# Patient Record
Sex: Female | Born: 1935 | Race: White | Hispanic: No | Marital: Married | Smoking: Never smoker
Health system: Southern US, Community
[De-identification: ages and names within clinical notes are randomized; demographics above are authoritative.]

## PROBLEM LIST (undated history)

## (undated) DIAGNOSIS — K56609 Unspecified intestinal obstruction, unspecified as to partial versus complete obstruction: Secondary | ICD-10-CM

## (undated) DIAGNOSIS — E039 Hypothyroidism, unspecified: Secondary | ICD-10-CM

## (undated) DIAGNOSIS — K460 Unspecified abdominal hernia with obstruction, without gangrene: Secondary | ICD-10-CM

## (undated) HISTORY — PX: HERNIA REPAIR: SHX51

## (undated) HISTORY — PX: OTHER SURGICAL HISTORY: SHX169

## (undated) HISTORY — PX: BOWEL RESECTION: SHX1257

---

## 2014-02-24 ENCOUNTER — Emergency Department (HOSPITAL_BASED_OUTPATIENT_CLINIC_OR_DEPARTMENT_OTHER): Payer: Medicare Other

## 2014-02-24 ENCOUNTER — Emergency Department (HOSPITAL_BASED_OUTPATIENT_CLINIC_OR_DEPARTMENT_OTHER)
Admission: EM | Admit: 2014-02-24 | Discharge: 2014-02-24 | Disposition: A | Discharge status: Home / Self Care | Payer: Medicare Other | Attending: Emergency Medicine | Admitting: Emergency Medicine

## 2014-02-24 ENCOUNTER — Encounter (HOSPITAL_BASED_OUTPATIENT_CLINIC_OR_DEPARTMENT_OTHER): Payer: Self-pay | Admitting: Emergency Medicine

## 2014-02-24 DIAGNOSIS — Z79899 Other long term (current) drug therapy: Secondary | ICD-10-CM | POA: Insufficient documentation

## 2014-02-24 DIAGNOSIS — R1033 Periumbilical pain: Secondary | ICD-10-CM | POA: Insufficient documentation

## 2014-02-24 DIAGNOSIS — Z8719 Personal history of other diseases of the digestive system: Secondary | ICD-10-CM | POA: Insufficient documentation

## 2014-02-24 DIAGNOSIS — R1013 Epigastric pain: Secondary | ICD-10-CM | POA: Insufficient documentation

## 2014-02-24 DIAGNOSIS — R109 Unspecified abdominal pain: Secondary | ICD-10-CM

## 2014-02-24 HISTORY — DX: Unspecified intestinal obstruction, unspecified as to partial versus complete obstruction: K56.609

## 2014-02-24 HISTORY — DX: Unspecified abdominal hernia with obstruction, without gangrene: K46.0

## 2014-02-24 LAB — CBC WITH DIFFERENTIAL/PLATELET
BASOS ABS: 0 10*3/uL (ref 0.0–0.1)
Basophils Relative: 0 % (ref 0–1)
EOS PCT: 3 % (ref 0–5)
Eosinophils Absolute: 0.2 10*3/uL (ref 0.0–0.7)
HCT: 39.6 % (ref 36.0–46.0)
Hemoglobin: 13 g/dL (ref 12.0–15.0)
LYMPHS PCT: 17 % (ref 12–46)
Lymphs Abs: 1 10*3/uL (ref 0.7–4.0)
MCH: 30 pg (ref 26.0–34.0)
MCHC: 32.8 g/dL (ref 30.0–36.0)
MCV: 91.5 fL (ref 78.0–100.0)
Monocytes Absolute: 0.5 10*3/uL (ref 0.1–1.0)
Monocytes Relative: 8 % (ref 3–12)
NEUTROS PCT: 72 % (ref 43–77)
Neutro Abs: 4.1 10*3/uL (ref 1.7–7.7)
Platelets: 242 10*3/uL (ref 150–400)
RBC: 4.33 MIL/uL (ref 3.87–5.11)
RDW: 13.7 % (ref 11.5–15.5)
WBC: 5.7 10*3/uL (ref 4.0–10.5)

## 2014-02-24 LAB — COMPREHENSIVE METABOLIC PANEL
ALT: 9 U/L (ref 0–35)
AST: 13 U/L (ref 0–37)
Albumin: 4.4 g/dL (ref 3.5–5.2)
Alkaline Phosphatase: 45 U/L (ref 39–117)
BUN: 21 mg/dL (ref 6–23)
CALCIUM: 10.4 mg/dL (ref 8.4–10.5)
CO2: 31 meq/L (ref 19–32)
CREATININE: 1.2 mg/dL — AB (ref 0.50–1.10)
Chloride: 99 mEq/L (ref 96–112)
GFR calc Af Amer: 50 mL/min — ABNORMAL LOW (ref >90)
GFR, EST NON AFRICAN AMERICAN: 43 mL/min — AB (ref >90)
Glucose, Bld: 121 mg/dL — ABNORMAL HIGH (ref 70–99)
Potassium: 4.9 mEq/L (ref 3.7–5.3)
Sodium: 142 mEq/L (ref 137–147)
TOTAL PROTEIN: 6.9 g/dL (ref 6.0–8.3)
Total Bilirubin: 0.4 mg/dL (ref 0.3–1.2)

## 2014-02-24 LAB — LIPASE, BLOOD: Lipase: 39 U/L (ref 11–59)

## 2014-02-24 MED ORDER — SODIUM CHLORIDE 0.9 % IV BOLUS (SEPSIS)
500.0000 mL | Freq: Once | INTRAVENOUS | Status: AC
  Administered 2014-02-24: 500 mL via INTRAVENOUS

## 2014-02-24 MED ORDER — IOHEXOL 300 MG/ML  SOLN
80.0000 mL | Freq: Once | INTRAMUSCULAR | Status: AC | PRN
  Administered 2014-02-24: 80 mL via INTRAVENOUS

## 2014-02-24 MED ORDER — IOHEXOL 300 MG/ML  SOLN
50.0000 mL | Freq: Once | INTRAMUSCULAR | Status: AC | PRN
  Administered 2014-02-24: 50 mL via ORAL

## 2014-02-24 NOTE — ED Notes (Signed)
MD at bedside. 

## 2014-02-24 NOTE — ED Notes (Signed)
Patient transported to CT 

## 2014-02-24 NOTE — ED Provider Notes (Signed)
CSN: [id number]     Arrival date & time 02/24/14  1947 History  This chart was scribed for Geoffery Lyonsouglas Chauna Osoria, MD by Evon Slackerrance Branch, ED Scribe. This patient was seen in room MH01/MH01 and the patient's care was started at 8:00 PM.   Chief Complaint  Patient presents with  . Abdominal Pain   Patient is a 79 y.o. female presenting with abdominal pain. The history is provided by the patient. No language interpreter was used.  Abdominal Pain Associated symptoms: no chest pain, no diarrhea, no dysuria, no fever, no shortness of breath and no vomiting    HPI Comments: Christina RobertsHelen Graves is a 79 y.o. female who presents to the Emergency Department complaining of abdominal pain onset 2 hours prior. She states the pain is all over. She states she has had similar symptoms when she had an incarcerated hernia 16 years prior. States she is on Amitiza. She states she hasn't had a bowel movement since Thursday. She states that she has been having air over the past few days. She denies chest pain, SOB, vomiting, diarrhea, fever, dysuria, or back pain.   Past Medical History  Diagnosis Date  . Bowel obstruction   . Incarcerated hernia    Past Surgical History  Procedure Laterality Date  . Hernia repair     No family history on file. History  Substance Use Topics  . Smoking status: Never Smoker   . Smokeless tobacco: Not on file  . Alcohol Use: No   OB History   Grav Para Term Preterm Abortions TAB SAB Ect Mult Living                 Review of Systems  Constitutional: Negative for fever.  Respiratory: Negative for shortness of breath.   Cardiovascular: Negative for chest pain.  Gastrointestinal: Positive for abdominal pain. Negative for vomiting and diarrhea.  Genitourinary: Negative for dysuria.  Musculoskeletal: Negative for back pain.   A complete 10 system review of systems was obtained and all systems are negative except as noted in the HPI and PMH.     Allergies  Oxycodone  Home  Medications   Prior to Admission medications   Medication Sig Start Date End Date Taking? Authorizing Provider  ARIPiprazole (ABILIFY PO) Take by mouth.   Yes Historical Provider, MD  LAMOTRIGINE PO Take by mouth.   Yes Historical Provider, MD  lubiprostone (AMITIZA) 8 MCG capsule    Yes Historical Provider, MD   Triage Vitals: BP 177/68  Pulse 64  Temp(Src) 98.2 F (36.8 C) (Oral)  Resp 18  Ht 5\' 7"  (1.702 m)  Wt 135 lb (61.236 kg)  BMI 21.14 kg/m2  SpO2 100%  Physical Exam  Nursing note and vitals reviewed. Constitutional: She is oriented to person, place, and time. She appears well-developed and well-nourished. No distress.  HENT:  Head: Normocephalic and atraumatic.  Eyes: Conjunctivae and EOM are normal.  Neck: Normal range of motion. No tracheal deviation present.  Cardiovascular: Normal rate.   Pulmonary/Chest: Effort normal. No respiratory distress.  Abdominal: There is tenderness. There is no rebound and no guarding.  Tenderness to epigastrium and paraumbilical region   Musculoskeletal: Normal range of motion.  Neurological: She is alert and oriented to person, place, and time.  Skin: Skin is warm and dry.  Psychiatric: She has a normal mood and affect. Her behavior is normal.    ED Course  Procedures (including critical care time) DIAGNOSTIC STUDIES: Oxygen Saturation is 100% on RA, normal by my interpretation.  COORDINATION OF CARE: 8:06 PM-Discussed treatment plan which includes CT scan of abdomen, CBC panel, CMP,  And UA with pt at bedside and pt agreed to plan.     Labs Review Labs Reviewed  COMPREHENSIVE METABOLIC PANEL - Abnormal; Notable for the following:    Glucose, Bld 121 (*)    Creatinine, Ser 1.20 (*)    GFR calc non Af Amer 43 (*)    GFR calc Af Amer 50 (*)    All other components within normal limits  CBC WITH DIFFERENTIAL  LIPASE, BLOOD  URINALYSIS, ROUTINE W REFLEX MICROSCOPIC    Imaging Review No results found.   EKG  Interpretation None      MDM   Final diagnoses:  None   Patient presents with severe abdominal pain.  On exam, she is tender in the epigastric and periumbilical region with no rebound or guarding. Blood counts revealed no leukocytosis electrolytes are essentially unremarkable. There is no elevation of lipase to suggest pancreatitis. A CT scan of the abdomen and pelvis was obtained which revealed no acute intra-abdominal abnormality. There is no evidence for bowel obstruction. While in the emergency department, her symptoms significantly improved. I see nothing acutely abnormal in need of surgical consultation or admission. She will be discharged to home with recommendations to take magnesium citrate and she tells me she has not had a bowel movement in 2 or 3 days. She understands to return if she develops worsening pain, high fever, or bloody stool.   I personally performed the services described in this documentation, which was scribed in my presence. The recorded information has been reviewed and is accurate.       Geoffery Lyonsouglas Lurie Mullane, MD 02/24/14 2239

## 2014-02-24 NOTE — ED Notes (Signed)
Attempt x 2 unsuccessful for piv placement- labs drawn on second attempt and sent to lab- pt drinking oral CT contrast at this time- will reassess for piv placement

## 2014-02-24 NOTE — ED Notes (Signed)
Reports onset of severe abdominal pain several hours ago.  Reports this pain is similar to a bowel obstruction/ incarcerated hernia 16 years ago.  Denies chest pain, SHOB.  Denies vomiting, diarrhea, or fever.

## 2014-02-24 NOTE — Discharge Instructions (Signed)
Magnesium citrate: Drink 2/3 of the 10 ounce bottle mixed with equal parts Gatorade or Sprite.  Return to the emergency department if you develop worsening pain, high fever, bloody stool, or other new and concerning symptoms.   Abdominal Pain, Adult Many things can cause abdominal pain. Usually, abdominal pain is not caused by a disease and will improve without treatment. It can often be observed and treated at home. Your health care provider will do a physical exam and possibly order blood tests and X-rays to help determine the seriousness of your pain. However, in many cases, more time must pass before a clear cause of the pain can be found. Before that point, your health care provider may not know if you need more testing or further treatment. HOME CARE INSTRUCTIONS  Monitor your abdominal pain for any changes. The following actions may help to alleviate any discomfort you are experiencing:  Only take over-the-counter or prescription medicines as directed by your health care provider.  Do not take laxatives unless directed to do so by your health care provider.  Try a clear liquid diet (broth, tea, or water) as directed by your health care provider. Slowly move to a bland diet as tolerated. SEEK MEDICAL CARE IF:  You have unexplained abdominal pain.  You have abdominal pain associated with nausea or diarrhea.  You have pain when you urinate or have a bowel movement.  You experience abdominal pain that wakes you in the night.  You have abdominal pain that is worsened or improved by eating food.  You have abdominal pain that is worsened with eating fatty foods. SEEK IMMEDIATE MEDICAL CARE IF:   Your pain does not go away within 2 hours.  You have a fever.  You keep throwing up (vomiting).  Your pain is felt only in portions of the abdomen, such as the right side or the left lower portion of the abdomen.  You pass bloody or black tarry stools. MAKE SURE YOU:  Understand these  instructions.   Will watch your condition.   Will get help right away if you are not doing well or get worse.  Document Released: 06/10/2005 Document Revised: 06/21/2013 Document Reviewed: 05/10/2013 [facility] Patient Information 2014 [place], [place].

## 2016-01-13 ENCOUNTER — Emergency Department (HOSPITAL_BASED_OUTPATIENT_CLINIC_OR_DEPARTMENT_OTHER): Payer: Medicare Other

## 2016-01-13 ENCOUNTER — Encounter (HOSPITAL_BASED_OUTPATIENT_CLINIC_OR_DEPARTMENT_OTHER): Payer: Self-pay

## 2016-01-13 ENCOUNTER — Inpatient Hospital Stay (HOSPITAL_BASED_OUTPATIENT_CLINIC_OR_DEPARTMENT_OTHER)
Admission: EM | Admit: 2016-01-13 | Discharge: 2016-01-15 | DRG: 390 | Disposition: A | Discharge status: Home / Self Care | Payer: Medicare Other | Attending: Internal Medicine | Admitting: Internal Medicine

## 2016-01-13 DIAGNOSIS — K567 Ileus, unspecified: Secondary | ICD-10-CM

## 2016-01-13 DIAGNOSIS — Z79899 Other long term (current) drug therapy: Secondary | ICD-10-CM | POA: Diagnosis not present

## 2016-01-13 DIAGNOSIS — Z885 Allergy status to narcotic agent status: Secondary | ICD-10-CM

## 2016-01-13 DIAGNOSIS — N183 Chronic kidney disease, stage 3 unspecified: Secondary | ICD-10-CM

## 2016-01-13 DIAGNOSIS — F319 Bipolar disorder, unspecified: Secondary | ICD-10-CM | POA: Diagnosis present

## 2016-01-13 DIAGNOSIS — K5909 Other constipation: Secondary | ICD-10-CM | POA: Diagnosis present

## 2016-01-13 DIAGNOSIS — Z853 Personal history of malignant neoplasm of breast: Secondary | ICD-10-CM

## 2016-01-13 DIAGNOSIS — Z923 Personal history of irradiation: Secondary | ICD-10-CM

## 2016-01-13 DIAGNOSIS — Z8659 Personal history of other mental and behavioral disorders: Secondary | ICD-10-CM | POA: Diagnosis not present

## 2016-01-13 DIAGNOSIS — K565 Intestinal adhesions [bands], unspecified as to partial versus complete obstruction: Secondary | ICD-10-CM

## 2016-01-13 DIAGNOSIS — K56609 Unspecified intestinal obstruction, unspecified as to partial versus complete obstruction: Secondary | ICD-10-CM | POA: Diagnosis present

## 2016-01-13 DIAGNOSIS — K5669 Other intestinal obstruction: Secondary | ICD-10-CM | POA: Diagnosis not present

## 2016-01-13 DIAGNOSIS — E039 Hypothyroidism, unspecified: Secondary | ICD-10-CM | POA: Diagnosis present

## 2016-01-13 DIAGNOSIS — E038 Other specified hypothyroidism: Secondary | ICD-10-CM | POA: Diagnosis not present

## 2016-01-13 DIAGNOSIS — R109 Unspecified abdominal pain: Secondary | ICD-10-CM | POA: Diagnosis present

## 2016-01-13 HISTORY — DX: Hypothyroidism, unspecified: E03.9

## 2016-01-13 LAB — CBC WITH DIFFERENTIAL/PLATELET
BASOS ABS: 0 10*3/uL (ref 0.0–0.1)
BASOS PCT: 1 %
EOS ABS: 0.2 10*3/uL (ref 0.0–0.7)
Eosinophils Relative: 3 %
HCT: 40.6 % (ref 36.0–46.0)
HEMOGLOBIN: 13.2 g/dL (ref 12.0–15.0)
LYMPHS ABS: 0.8 10*3/uL (ref 0.7–4.0)
Lymphocytes Relative: 15 %
MCH: 29.9 pg (ref 26.0–34.0)
MCHC: 32.5 g/dL (ref 30.0–36.0)
MCV: 92.1 fL (ref 78.0–100.0)
Monocytes Absolute: 0.5 10*3/uL (ref 0.1–1.0)
Monocytes Relative: 9 %
NEUTROS PCT: 72 %
Neutro Abs: 3.9 10*3/uL (ref 1.7–7.7)
Platelets: 228 10*3/uL (ref 150–400)
RBC: 4.41 MIL/uL (ref 3.87–5.11)
RDW: 13.7 % (ref 11.5–15.5)
WBC: 5.3 10*3/uL (ref 4.0–10.5)

## 2016-01-13 LAB — COMPREHENSIVE METABOLIC PANEL
ALT: 11 U/L — ABNORMAL LOW (ref 14–54)
AST: 16 U/L (ref 15–41)
Albumin: 4.6 g/dL (ref 3.5–5.0)
Alkaline Phosphatase: 45 U/L (ref 38–126)
Anion gap: 8 (ref 5–15)
BUN: 22 mg/dL — AB (ref 6–20)
CHLORIDE: 102 mmol/L (ref 101–111)
CO2: 29 mmol/L (ref 22–32)
Calcium: 10 mg/dL (ref 8.9–10.3)
Creatinine, Ser: 1.22 mg/dL — ABNORMAL HIGH (ref 0.44–1.00)
GFR calc Af Amer: 48 mL/min — ABNORMAL LOW (ref >60)
GFR calc non Af Amer: 41 mL/min — ABNORMAL LOW (ref >60)
Glucose, Bld: 108 mg/dL — ABNORMAL HIGH (ref 65–99)
Potassium: 4.2 mmol/L (ref 3.5–5.1)
SODIUM: 139 mmol/L (ref 135–145)
Total Bilirubin: 0.6 mg/dL (ref 0.3–1.2)
Total Protein: 7.1 g/dL (ref 6.5–8.1)

## 2016-01-13 LAB — LIPASE, BLOOD: LIPASE: 27 U/L (ref 11–51)

## 2016-01-13 MED ORDER — IOPAMIDOL (ISOVUE-300) INJECTION 61%
100.0000 mL | Freq: Once | INTRAVENOUS | Status: AC | PRN
  Administered 2016-01-13: 100 mL via INTRAVENOUS

## 2016-01-13 MED ORDER — SODIUM CHLORIDE 0.9 % IV SOLN
INTRAVENOUS | Status: DC
  Administered 2016-01-14: 01:00:00 via INTRAVENOUS

## 2016-01-13 MED ORDER — SODIUM CHLORIDE 0.9 % IV BOLUS (SEPSIS)
250.0000 mL | Freq: Once | INTRAVENOUS | Status: AC
  Administered 2016-01-13: 250 mL via INTRAVENOUS

## 2016-01-13 MED ORDER — ONDANSETRON HCL 4 MG/2ML IJ SOLN
4.0000 mg | Freq: Once | INTRAMUSCULAR | Status: AC
  Administered 2016-01-13: 4 mg via INTRAVENOUS
  Filled 2016-01-13: qty 2

## 2016-01-13 MED ORDER — SODIUM CHLORIDE 0.9 % IV SOLN
INTRAVENOUS | Status: DC
  Administered 2016-01-13: 19:00:00 via INTRAVENOUS

## 2016-01-13 MED ORDER — FENTANYL CITRATE (PF) 100 MCG/2ML IJ SOLN
12.5000 ug | Freq: Once | INTRAMUSCULAR | Status: AC
  Administered 2016-01-13: 12.5 ug via INTRAVENOUS
  Filled 2016-01-13: qty 2

## 2016-01-13 NOTE — ED Notes (Signed)
Pt ambulate to restroom without assistance needed.

## 2016-01-13 NOTE — Plan of Care (Signed)
81 yo w prior history of groin strangulated hernia requiring surgery years ago here with small bowel obstruction Case has been discussed with Dr. Gerrit FriendsGerkin with carotid surgery patient accepted to MedSurg bed inpatient Christina Graves 10:07 PM

## 2016-01-13 NOTE — ED Notes (Signed)
Patient transported to CT 

## 2016-01-13 NOTE — ED Notes (Signed)
abd pain x today-denies n/v/d-grimacing in pain-steady gait to tx area

## 2016-01-13 NOTE — ED Provider Notes (Addendum)
CSN: [id number]     Arrival date & time 01/13/16  1820 History  By signing my name below, I, [name], attest that this documentation has been prepared under the direction and in the presence of physician practitioner, Vanetta Mulders, MD. Electronically Signed: [name], Scribe. 01/13/2016. 6:58 PM.   Chief Complaint  Patient presents with  . Abdominal Pain    Patient is a 81 y.o. female presenting with abdominal pain. The history is provided by the patient and a relative. No language interpreter was used.  Abdominal Pain Pain location:  Generalized Pain severity:  Severe Onset quality:  Sudden Duration:  2 hours Timing:  Intermittent Progression:  Unable to specify Chronicity:  New Associated symptoms: constipation   Associated symptoms: no chest pain, no chills, no cough, no diarrhea, no dysuria, no fever, no nausea, no shortness of breath, no sore throat and no vomiting   Risk factors: being elderly      HPI Comments: Christina Graves is a 81 y.o. female with h/o bowel obstruction and incarcerated hernia who presents to the Emergency Department complaining of sudden onset, intermittent, severe, 10/10, generalized abdominal pain beginning approximately 2 hours ago. Pt states that her pain does not radiate. She endorses associated constipation. Pt is allergic to oxycodone. She recently completed radiation therapy for cancer. Pt denies nausea, vomiting, diarrhea, or any other associated symptoms. Pt has a PCP at [facility].  Past Medical History  Diagnosis Date  . Bowel obstruction (HCC)   . Incarcerated hernia    Past Surgical History  Procedure Laterality Date  . Hernia repair    . Bowel resection     No family history on file. Social History  Substance Use Topics  . Smoking status: Never Smoker   . Smokeless tobacco: None  . Alcohol Use: No   OB History    No data available     Review of Systems  Constitutional: Negative for fever and  chills.  HENT: Negative for rhinorrhea and sore throat.   Eyes: Negative for visual disturbance.  Respiratory: Negative for cough and shortness of breath.   Cardiovascular: Negative for chest pain.  Gastrointestinal: Positive for abdominal pain (generalized) and constipation. Negative for nausea, vomiting and diarrhea.  Genitourinary: Negative for dysuria.  Musculoskeletal: Negative for back pain and joint swelling.  Skin: Negative for rash.  Neurological: Negative for headaches.  Hematological: Does not bruise/bleed easily.  Psychiatric/Behavioral: Negative for confusion.    Allergies  Oxycodone  Home Medications   Prior to Admission medications   Medication Sig Start Date End Date Taking? Authorizing Provider  ARIPiprazole (ABILIFY PO) Take 15 mg by mouth every morning.    Yes Historical Provider, MD  Ciclopirox 1 % shampoo Apply topically at bedtime.   Yes Historical Provider, MD  LAMOTRIGINE PO Take 100 mg by mouth every morning.    Yes Historical Provider, MD  levothyroxine (SYNTHROID, LEVOTHROID) 50 MCG tablet Take 50 mcg by mouth daily before breakfast.   Yes Historical Provider, MD  metroNIDAZOLE (METROGEL) 1 % gel Apply topically daily.   Yes Historical Provider, MD  traZODone (DESYREL) 100 MG tablet Take 100 mg by mouth at bedtime.   Yes Historical Provider, MD  lubiprostone (AMITIZA) 8 MCG capsule 24 mcg every evening.     Historical Provider, MD   BP 119/71 mmHg  Pulse 67  Temp(Src) 98.2 F (36.8 C) (Oral)  Resp 18  SpO2 96% Physical Exam  Constitutional: She is oriented to person, place, and time.  She appears well-developed and well-nourished. No distress.  HENT:  Head: Normocephalic and atraumatic.  Mouth/Throat: Mucous membranes are normal.  Eyes: Conjunctivae and EOM are normal. Pupils are equal, round, and reactive to light.  Sclera are clear bilaterally.  Neck: Neck supple. No tracheal deviation present.  Cardiovascular: Normal rate, regular rhythm and  normal heart sounds.   Pulmonary/Chest: Effort normal and breath sounds normal. No respiratory distress.  Abdominal: She exhibits distension. Bowel sounds are decreased. There is no tenderness.  Bowel sounds present but decreased.  Musculoskeletal: Normal range of motion.       Right ankle: She exhibits no swelling.       Left ankle: She exhibits no swelling.  Neurological: She is alert and oriented to person, place, and time. She has normal reflexes. She displays normal reflexes. She exhibits normal muscle tone. Coordination normal.  Skin: Skin is warm and dry.  Psychiatric: She has a normal mood and affect. Her behavior is normal.  Nursing note and vitals reviewed.   ED Course  Procedures (including critical care time)  DIAGNOSTIC STUDIES: Oxygen Saturation is 96% on RA, adequate by my interpretation.    COORDINATION OF CARE: 6:58 PM Discussed treatment plan with pt at bedside and pt agreed to plan.  Medications  0.9 %  sodium chloride infusion ( Intravenous New Bag/Given 01/13/16 1915)  ondansetron (ZOFRAN) injection 4 mg (4 mg Intravenous Given 01/13/16 1914)  sodium chloride 0.9 % bolus 250 mL (0 mLs Intravenous Stopped 01/13/16 1940)  fentaNYL (SUBLIMAZE) injection 12.5 mcg (12.5 mcg Intravenous Given 01/13/16 1914)  iopamidol (ISOVUE-300) 61 % injection 100 mL (100 mLs Intravenous Contrast Given 01/13/16 2006)    Results for orders placed or performed during the hospital encounter of 01/13/16  CBC with Differential/Platelet  Result Value Ref Range   WBC 5.3 4.0 - 10.5 K/uL   RBC 4.41 3.87 - 5.11 MIL/uL   Hemoglobin 13.2 12.0 - 15.0 g/dL   HCT 40.9 81.1 - 91.4 %   MCV 92.1 78.0 - 100.0 fL   MCH 29.9 26.0 - 34.0 pg   MCHC 32.5 30.0 - 36.0 g/dL   RDW 78.2 95.6 - 21.3 %   Platelets 228 150 - 400 K/uL   Neutrophils Relative % 72 %   Neutro Abs 3.9 1.7 - 7.7 K/uL   Lymphocytes Relative 15 %   Lymphs Abs 0.8 0.7 - 4.0 K/uL   Monocytes Relative 9 %   Monocytes Absolute 0.5 0.1 -  1.0 K/uL   Eosinophils Relative 3 %   Eosinophils Absolute 0.2 0.0 - 0.7 K/uL   Basophils Relative 1 %   Basophils Absolute 0.0 0.0 - 0.1 K/uL  Lipase, blood  Result Value Ref Range   Lipase 27 11 - 51 U/L  Comprehensive metabolic panel  Result Value Ref Range   Sodium 139 135 - 145 mmol/L   Potassium 4.2 3.5 - 5.1 mmol/L   Chloride 102 101 - 111 mmol/L   CO2 29 22 - 32 mmol/L   Glucose, Bld 108 (H) 65 - 99 mg/dL   BUN 22 (H) 6 - 20 mg/dL   Creatinine, Ser 0.86 (H) 0.44 - 1.00 mg/dL   Calcium 57.8 8.9 - 46.9 mg/dL   Total Protein 7.1 6.5 - 8.1 g/dL   Albumin 4.6 3.5 - 5.0 g/dL   AST 16 15 - 41 U/L   ALT 11 (L) 14 - 54 U/L   Alkaline Phosphatase 45 38 - 126 U/L   Total Bilirubin 0.6 0.3 -  1.2 mg/dL   GFR calc non Af Amer 41 (L) >60 mL/min   GFR calc Af Amer 48 (L) >60 mL/min   Anion gap 8 5 - 15   Ct Abdomen Pelvis W Contrast  01/13/2016  CLINICAL DATA:  Generalized abdominal pain since 5 p.m. tonight. History of herniated incarcerated bowel. EXAM: CT ABDOMEN AND PELVIS WITH CONTRAST TECHNIQUE: Multidetector CT imaging of the abdomen and pelvis was performed using the standard protocol following bolus administration of intravenous contrast. CONTRAST:  100mL ISOVUE-300 IOPAMIDOL (ISOVUE-300) INJECTION 61% COMPARISON:  CT abdomen pelvis -02/24/2014 FINDINGS: Lower chest: Limited visualization of the lower thorax demonstrates minimal linear subsegmental atelectasis within the image right lower lobe. No focal airspace opacities. No pleural effusion. Normal heart size.  No pericardial effusion. Hepatobiliary: Normal hepatic contour. Note is made of an approximately 1.1 cm hypo attenuating (18 Hounsfield unit) cyst within the caudal subcapsular aspect of the medial segment of the left lobe of the liver (coronal image 41, series 5). Additional sub cm hypo attenuating hepatic lesions too small to accurately characterize though favored to represent hepatic cysts. Normal appearance of the  gallbladder given degree distention. No radiopaque gallstones. No intra or extrahepatic bili duct dilatation. No ascites. Pancreas: Normal in appearance Spleen: Normal in appearance Adrenals/Urinary Tract: There is symmetric enhancement and excretion of the bilateral kidneys. Note is made of a large (approximately 7.4 cm hypo attenuating (7 Hounsfield unit) cyst which arises exophytic Li from the interpolar aspect of the right kidney and is noted to contain a thin internal septation, similar to the 02/2014 examination. Additional renal cysts are seen bilaterally. Sub cm hypo attenuating renal lesions are too small to adequately characterize of favored to represent additional renal cysts. Punctate (approximately 1.3 cm) exophytic hyper attenuating though nonenhancing lesion arising from the inferior pole the left kidney is unchanged since the 02/2014 examination and compatible with a hyperdense renal cysts. No definite renal stones this postcontrast examination. Note is again made of a exaggerated horizontal lie of the right kidney. No urinary obstruction or perinephric stranding. Normal appearance of the bilateral adrenal glands. Normal appearance of the urinary bladder given degree distention. Multiple phleboliths are seen within the lower pelvis bilaterally. Stomach/Bowel: Postsurgical change involving a loop of small bowel within the midline of the abdomen (representative coronal image 10, series 5). There is mild upstream fluid distention of the small bowel with apparent transition point within the distal small bowel within the midline of the lower abdomen/upper pelvis (representative axial image 53, series 2, coronal image 24, series 5) with relative decompression of the more distal downstream small bowel worrisome for a developing small bowel obstruction. No evidence of perforation or definable/ drainable fluid collection. No pneumoperitoneum, pneumatosis or portal venous gas. Scattered colonic diverticulosis  without evidence of diverticulitis. Normal appearance of the terminal ileum and diminutive appearing appendix. Vascular/Lymphatic: Moderate amount of slightly irregular predominantly calcified plaque within a tortuous but normal caliber abdominal aorta. The major branch vessels of the abdominal aorta appear patent on this non CTA examination. No bulky retroperitoneal, mesenteric, pelvic or inguinal lymphadenopathy. Reproductive: Normal appearance of the pelvic organs for age. No discrete adnexal lesion. No free fluid in the pelvic cul-de-sac. Other: Tiny mesenteric fat containing periumbilical hernia. Regional soft tissues appear otherwise normal. Musculoskeletal: No acute or aggressive osseous abnormalities. Mild-to-moderate multilevel lumbar spine DDD. Old bilateral superior and inferior pubic rami fractures. Mild degenerative change of the bilateral hips, right greater than left. IMPRESSION: 1. Findings worrisome for small bowel obstruction  with transition point located within the midline of the lower abdomen/upper pelvis. The etiology of this apparent transition point is not identified and thus is presumably secondary to adhesions. 2. Postsurgical change involving an adjacent loop of small bowel. 3. Colonic diverticulosis without evidence of diverticulitis. 4. Old bilateral superior and inferior pubic rami fractures Electronically Signed   By: Simonne Come M.D.   On: 01/13/2016 20:34     EKG Interpretation None      MDM   Final diagnoses:  Small bowel obstruction (HCC)   CT scan concerning for small bowel obstruction with transit position point located in the midline of the lower abdomen upper pelvis. We'll discuss with general surgery at [facility]. Patient relatively health the. Patient did have a bowel obstruction due to incarcerated hernia with strangulation about 12 years ago. Did have to have part of bowel resected. Otherwise healthy.  Recent diagnosis of early breast cancer with  the lumpectomy and radiation treatment. Never received any chemotherapy.   Discussed with Dr. Gerrit Friends from general surgery he will consult. Internal medicine hospitalist will admit to [facility] long MedSurg bed.  I personally performed the services described in this documentation, which was scribed in my presence. The recorded information has been reviewed and is accurate.     Vanetta Mulders, MD 01/13/16 [id number]  Vanetta Mulders, MD 01/13/16 [phone]

## 2016-01-14 ENCOUNTER — Inpatient Hospital Stay (HOSPITAL_COMMUNITY): Payer: Medicare Other

## 2016-01-14 ENCOUNTER — Encounter (HOSPITAL_COMMUNITY): Payer: Self-pay | Admitting: Surgery

## 2016-01-14 DIAGNOSIS — Z8659 Personal history of other mental and behavioral disorders: Secondary | ICD-10-CM

## 2016-01-14 DIAGNOSIS — Z853 Personal history of malignant neoplasm of breast: Secondary | ICD-10-CM

## 2016-01-14 DIAGNOSIS — E039 Hypothyroidism, unspecified: Secondary | ICD-10-CM | POA: Diagnosis present

## 2016-01-14 DIAGNOSIS — K5669 Other intestinal obstruction: Secondary | ICD-10-CM

## 2016-01-14 DIAGNOSIS — N183 Chronic kidney disease, stage 3 unspecified: Secondary | ICD-10-CM

## 2016-01-14 LAB — GLUCOSE, CAPILLARY
GLUCOSE-CAPILLARY: 163 mg/dL — AB (ref 65–99)
Glucose-Capillary: 74 mg/dL (ref 65–99)
Glucose-Capillary: 98 mg/dL (ref 65–99)
Glucose-Capillary: 99 mg/dL (ref 65–99)

## 2016-01-14 LAB — CBC WITH DIFFERENTIAL/PLATELET
BASOS ABS: 0 10*3/uL (ref 0.0–0.1)
BASOS PCT: 0 %
Eosinophils Absolute: 0.1 10*3/uL (ref 0.0–0.7)
Eosinophils Relative: 3 %
HEMATOCRIT: 38.3 % (ref 36.0–46.0)
Hemoglobin: 12.3 g/dL (ref 12.0–15.0)
Lymphocytes Relative: 11 %
Lymphs Abs: 0.6 10*3/uL — ABNORMAL LOW (ref 0.7–4.0)
MCH: 29.3 pg (ref 26.0–34.0)
MCHC: 32.1 g/dL (ref 30.0–36.0)
MCV: 91.2 fL (ref 78.0–100.0)
MONO ABS: 0.5 10*3/uL (ref 0.1–1.0)
Monocytes Relative: 9 %
NEUTROS ABS: 4.4 10*3/uL (ref 1.7–7.7)
Neutrophils Relative %: 77 %
PLATELETS: 221 10*3/uL (ref 150–400)
RBC: 4.2 MIL/uL (ref 3.87–5.11)
RDW: 14 % (ref 11.5–15.5)
WBC: 5.6 10*3/uL (ref 4.0–10.5)

## 2016-01-14 LAB — COMPREHENSIVE METABOLIC PANEL
ALBUMIN: 4 g/dL (ref 3.5–5.0)
ALK PHOS: 35 U/L — AB (ref 38–126)
ALT: 10 U/L — ABNORMAL LOW (ref 14–54)
ANION GAP: 8 (ref 5–15)
AST: 13 U/L — ABNORMAL LOW (ref 15–41)
BILIRUBIN TOTAL: 0.4 mg/dL (ref 0.3–1.2)
BUN: 19 mg/dL (ref 6–20)
CALCIUM: 9.3 mg/dL (ref 8.9–10.3)
CO2: 28 mmol/L (ref 22–32)
Chloride: 108 mmol/L (ref 101–111)
Creatinine, Ser: 1.25 mg/dL — ABNORMAL HIGH (ref 0.44–1.00)
GFR, EST AFRICAN AMERICAN: 46 mL/min — AB (ref >60)
GFR, EST NON AFRICAN AMERICAN: 40 mL/min — AB (ref >60)
GLUCOSE: 92 mg/dL (ref 65–99)
POTASSIUM: 4.1 mmol/L (ref 3.5–5.1)
Sodium: 144 mmol/L (ref 135–145)
TOTAL PROTEIN: 6.2 g/dL — AB (ref 6.5–8.1)

## 2016-01-14 MED ORDER — LEVOTHYROXINE SODIUM 100 MCG IV SOLR
25.0000 ug | Freq: Every day | INTRAVENOUS | Status: DC
  Administered 2016-01-14: 25 ug via INTRAVENOUS
  Filled 2016-01-14: qty 5

## 2016-01-14 MED ORDER — LEVOTHYROXINE SODIUM 50 MCG PO TABS
50.0000 ug | ORAL_TABLET | Freq: Every day | ORAL | Status: DC
  Administered 2016-01-15: 50 ug via ORAL
  Filled 2016-01-14 (×2): qty 1

## 2016-01-14 MED ORDER — ONDANSETRON HCL 4 MG/2ML IJ SOLN: 4.0000 mg | Freq: Four times a day (QID) | INTRAMUSCULAR | Status: DC | PRN

## 2016-01-14 MED ORDER — ACETAMINOPHEN 325 MG PO TABS
650.0000 mg | ORAL_TABLET | Freq: Four times a day (QID) | ORAL | Status: DC | PRN
  Administered 2016-01-15: 650 mg via ORAL
  Filled 2016-01-14: qty 2

## 2016-01-14 MED ORDER — TRAZODONE HCL 100 MG PO TABS
100.0000 mg | ORAL_TABLET | Freq: Every day | ORAL | Status: DC
  Administered 2016-01-14: 100 mg via ORAL
  Filled 2016-01-14 (×2): qty 1

## 2016-01-14 MED ORDER — ONDANSETRON HCL 4 MG PO TABS: 4.0000 mg | ORAL_TABLET | Freq: Four times a day (QID) | ORAL | Status: DC | PRN

## 2016-01-14 MED ORDER — DEXTROSE-NACL 5-0.45 % IV SOLN: INTRAVENOUS | Status: DC

## 2016-01-14 MED ORDER — ACETAMINOPHEN 650 MG RE SUPP: 650.0000 mg | Freq: Four times a day (QID) | RECTAL | Status: DC | PRN

## 2016-01-14 MED ORDER — ARIPIPRAZOLE 15 MG PO TABS
15.0000 mg | ORAL_TABLET | Freq: Every morning | ORAL | Status: DC
  Administered 2016-01-14 – 2016-01-15 (×2): 15 mg via ORAL
  Filled 2016-01-14 (×2): qty 1

## 2016-01-14 MED ORDER — METRONIDAZOLE 1 % EX GEL: Freq: Every day | CUTANEOUS | Status: DC

## 2016-01-14 MED ORDER — BISACODYL 10 MG RE SUPP
10.0000 mg | Freq: Two times a day (BID) | RECTAL | Status: DC
  Administered 2016-01-15: 10 mg via RECTAL
  Filled 2016-01-14: qty 1

## 2016-01-14 MED ORDER — DEXTROSE-NACL 5-0.45 % IV SOLN
INTRAVENOUS | Status: DC
  Administered 2016-01-14 – 2016-01-15 (×2): via INTRAVENOUS

## 2016-01-14 MED ORDER — DEXTROSE-NACL 5-0.9 % IV SOLN
INTRAVENOUS | Status: DC
  Administered 2016-01-14: 75 mL/h via INTRAVENOUS

## 2016-01-14 MED ORDER — LAMOTRIGINE 100 MG PO TABS
100.0000 mg | ORAL_TABLET | Freq: Every morning | ORAL | Status: DC
  Administered 2016-01-14 – 2016-01-15 (×2): 100 mg via ORAL
  Filled 2016-01-14 (×2): qty 1

## 2016-01-14 NOTE — Progress Notes (Signed)
PROGRESS NOTE  Christina Graves ZOX:[id number] DOB: Apr 26, 1935 DOA: 01/13/2016 PCP: Nadara Eaton, MD Outpatient Specialists:  Fredricka Bonine, [facility] [place], [facility] [place], [place] [facility]  Brief History:  81 y.o. female with medical history significant of breath history of breast cancer presently on radiation therapy, hypothyroidism and bipolar disorder with previous history of incarcerated bowel in the hernia presents to the ER with complaints of abdominal pain that started at 5 PM on 01/13/2016. Her last bowel movement was 01/09/2016.  Workup in the emergency department including CT of the abdomen and pelvis revealed small bowel obstruction with the transition point at the midabdomen. Gen. surgery was consulted and recommended nonoperative management. The patient was made nothing by mouth and started on intravenous fluids.  Fortunately, she is improving clinically and repeat x-rays on 01/14/2016 suggest ileus. Therefore, the patient was started on clear liquids.  Assessment/Plan: Small bowel obstruction -appreciate general surgery follow up -01/14/2016 AXR--mildly dilated small bowel loops -01/14/2016--start clear liquid diet -Continue intravenous fluids -bisacodyl per surgery  Hypothyroidism -change synthroid back to po  Bipolar disorder -Restart Abilify, trazodone, Lamictal  CKD stage III -Baseline creatinine 1.1-1.2  Breast cancer -Follows with radiation and medical oncology and [place] -last XRT 2 weeks prior to admission  Disposition Plan:   Home in 1-2 days  Family Communication:   Husband updated at beside  Consultants:  General Surgery  Code Status:  FULL    Subjective: pt is passing flatus and had a bowel movement today. No vomiting. Denies fevers, chills, chest pain, shortness breath. Abdominal pain is improving.  Objective: Filed Vitals:   01/13/16 2222 01/14/16 0024 01/14/16 1006 01/14/16 1424    BP: 112/63 111/65  128/73  Pulse: 70 61  58  Temp:  98.5 F (36.9 C)  98.2 F (36.8 C)  TempSrc:  Oral  Oral  Resp: 18 16  16   Height:   5\' 6"  (1.676 m)   Weight:   61.961 kg (136 lb 9.6 oz)   SpO2: 95% 94%  96%    Intake/Output Summary (Last 24 hours) at 01/14/16 1455 Last data filed at 01/14/16 1424  Gross per 24 hour  Intake  412.5 ml  Output      1 ml  Net  411.5 ml   Weight change:  Exam:   General:  Pt is alert, follows commands appropriately, not in acute distress  HEENT: No icterus, No thrush, No neck mass, [place]/AT  Cardiovascular: RRR, S1/S2, no rubs, no gallops  Respiratory: CTA bilaterally, no wheezing, no crackles, no rhonchi  Abdomen: Soft/+BS, non tender, non distended, no guarding  Extremities: 1+ LE edema, No lymphangitis, No petechiae, No rashes, no synovitis   Data Reviewed: I have personally reviewed following labs and imaging studies Basic Metabolic Panel:  Recent Labs Lab 01/13/16 1911 01/14/16 0615  NA 139 144  K 4.2 4.1  CL 102 108  CO2 29 28  GLUCOSE 108* 92  BUN 22* 19  CREATININE 1.22* 1.25*  CALCIUM 10.0 9.3   Liver Function Tests:  Recent Labs Lab 01/13/16 1911 01/14/16 0615  AST 16 13*  ALT 11* 10*  ALKPHOS 45 35*  BILITOT 0.6 0.4  PROT 7.1 6.2*  ALBUMIN 4.6 4.0    Recent Labs Lab 01/13/16 1911  LIPASE 27   No results for input(s): AMMONIA in the last 168 hours. Coagulation Profile: No results for input(s): INR, PROTIME in the  last 168 hours. CBC:  Recent Labs Lab 01/13/16 1911 01/14/16 0615  WBC 5.3 5.6  NEUTROABS 3.9 4.4  HGB 13.2 12.3  HCT 40.6 38.3  MCV 92.1 91.2  PLT 228 221   Cardiac Enzymes: No results for input(s): CKTOTAL, CKMB, CKMBINDEX, TROPONINI in the last 168 hours. BNP: Invalid input(s): POCBNP CBG:  Recent Labs Lab 01/14/16 0649 01/14/16 1259  GLUCAP 74 163*   HbA1C: No results for input(s): HGBA1C in the last 72 hours. Urine analysis: No results found for: COLORURINE,  APPEARANCEUR, LABSPEC, PHURINE, GLUCOSEU, HGBUR, BILIRUBINUR, KETONESUR, PROTEINUR, UROBILINOGEN, NITRITE, LEUKOCYTESUR Sepsis Labs: @LABRCNTIP (procalcitonin:4,lacticidven:4) )No results found for this or any previous visit (from the past 240 hour(s)).   Scheduled Meds: . ARIPiprazole  15 mg Oral q morning - 10a  . bisacodyl  10 mg Rectal BID  . lamoTRIgine  100 mg Oral q morning - 10a  . levothyroxine  25 mcg Intravenous Daily  . [START ON 01/15/2016] metroNIDAZOLE   Topical Daily  . traZODone  100 mg Oral QHS   Continuous Infusions: . dextrose 5 % and 0.9% NaCl 75 mL/hr (01/14/16 0600)    Procedures/Studies: Ct Abdomen Pelvis W Contrast  01/13/2016  CLINICAL DATA:  Generalized abdominal pain since 5 p.m. tonight. History of herniated incarcerated bowel. EXAM: CT ABDOMEN AND PELVIS WITH CONTRAST TECHNIQUE: Multidetector CT imaging of the abdomen and pelvis was performed using the standard protocol following bolus administration of intravenous contrast. CONTRAST:  100mL ISOVUE-300 IOPAMIDOL (ISOVUE-300) INJECTION 61% COMPARISON:  CT abdomen pelvis -02/24/2014 FINDINGS: Lower chest: Limited visualization of the lower thorax demonstrates minimal linear subsegmental atelectasis within the image right lower lobe. No focal airspace opacities. No pleural effusion. Normal heart size.  No pericardial effusion. Hepatobiliary: Normal hepatic contour. Note is made of an approximately 1.1 cm hypo attenuating (18 Hounsfield unit) cyst within the caudal subcapsular aspect of the medial segment of the left lobe of the liver (coronal image 41, series 5). Additional sub cm hypo attenuating hepatic lesions too small to accurately characterize though favored to represent hepatic cysts. Normal appearance of the gallbladder given degree distention. No radiopaque gallstones. No intra or extrahepatic bili duct dilatation. No ascites. Pancreas: Normal in appearance Spleen: Normal in appearance Adrenals/Urinary Tract: There  is symmetric enhancement and excretion of the bilateral kidneys. Note is made of a large (approximately 7.4 cm hypo attenuating (7 Hounsfield unit) cyst which arises exophytic Li from the interpolar aspect of the right kidney and is noted to contain a thin internal septation, similar to the 02/2014 examination. Additional renal cysts are seen bilaterally. Sub cm hypo attenuating renal lesions are too small to adequately characterize of favored to represent additional renal cysts. Punctate (approximately 1.3 cm) exophytic hyper attenuating though nonenhancing lesion arising from the inferior pole the left kidney is unchanged since the 02/2014 examination and compatible with a hyperdense renal cysts. No definite renal stones this postcontrast examination. Note is again made of a exaggerated horizontal lie of the right kidney. No urinary obstruction or perinephric stranding. Normal appearance of the bilateral adrenal glands. Normal appearance of the urinary bladder given degree distention. Multiple phleboliths are seen within the lower pelvis bilaterally. Stomach/Bowel: Postsurgical change involving a loop of small bowel within the midline of the abdomen (representative coronal image 10, series 5). There is mild upstream fluid distention of the small bowel with apparent transition point within the distal small bowel within the midline of the lower abdomen/upper pelvis (representative axial image 53, series 2, coronal image 24, series  5) with relative decompression of the more distal downstream small bowel worrisome for a developing small bowel obstruction. No evidence of perforation or definable/ drainable fluid collection. No pneumoperitoneum, pneumatosis or portal venous gas. Scattered colonic diverticulosis without evidence of diverticulitis. Normal appearance of the terminal ileum and diminutive appearing appendix. Vascular/Lymphatic: Moderate amount of slightly irregular predominantly calcified plaque within a  tortuous but normal caliber abdominal aorta. The major branch vessels of the abdominal aorta appear patent on this non CTA examination. No bulky retroperitoneal, mesenteric, pelvic or inguinal lymphadenopathy. Reproductive: Normal appearance of the pelvic organs for age. No discrete adnexal lesion. No free fluid in the pelvic cul-de-sac. Other: Tiny mesenteric fat containing periumbilical hernia. Regional soft tissues appear otherwise normal. Musculoskeletal: No acute or aggressive osseous abnormalities. Mild-to-moderate multilevel lumbar spine DDD. Old bilateral superior and inferior pubic rami fractures. Mild degenerative change of the bilateral hips, right greater than left. IMPRESSION: 1. Findings worrisome for small bowel obstruction with transition point located within the midline of the lower abdomen/upper pelvis. The etiology of this apparent transition point is not identified and thus is presumably secondary to adhesions. 2. Postsurgical change involving an adjacent loop of small bowel. 3. Colonic diverticulosis without evidence of diverticulitis. 4. Old bilateral superior and inferior pubic rami fractures Electronically Signed   By: Simonne Come M.D.   On: 01/13/2016 20:34   Dg Abd 2 Views  01/14/2016  CLINICAL DATA:  Small bowel obstruction due to adhesions. EXAM: ABDOMEN - 2 VIEW COMPARISON:  CT scan of Jan 13, 2016.  Radiograph of April 30, 2015. FINDINGS: Residual contrast is seen in distal colon. No colonic dilatation is noted. Mildly dilated small bowel loops are noted concerning for distal small bowel obstruction or ileus. Phleboliths are noted in the pelvis. No definite free air is noted. IMPRESSION: Mildly dilated small bowel loops are noted which may represent ileus or possibly distal small bowel obstruction. Residual contrast see seen in the distal colon. Electronically Signed   By: Lupita Raider, M.D.   On: 01/14/2016 09:13    Adenike Shidler, DO  [facility] Pager [phone]  If  7PM-7AM, please contact night-coverage [email or web address] Password [email or web address] 01/14/2016, 2:55 PM   LOS: 1 day

## 2016-01-14 NOTE — H&P (Signed)
History and Physical    Christina Graves WUJ:[id number] DOB: 1934/10/11 DOA: 01/13/2016  Referring MD/NP/PA: Patient was transferred from [facility] [place] [facility]. PCP: Nadara Eaton, MD  Outpatient Specialists: Patient's oncologist is in [facility]. Patient coming from: Home.  Chief Complaint: Abdominal pain.  HPI: Christina Graves is a 81 y.o. female with medical history significant of breath history of breast cancer presently on radiation therapy, hypothyroidism and bipolar disorder with previous history of incarcerated bowel in the hernia presents to the ER with complaints of abdominal pain. Patient's pain started last evening around 5 PM. Had some nausea denies any diarrhea. Has not moved her bowels for last 3-4 days. Denies any chest pain or shortness of breath. CT scan of the abdomen shows features concerning for small bowel obstruction with transition point. On-call surgeon and [facility] Dr. Gerrit Friends was consulted and patient has been admitted for further management of bowel obstruction.   ED Course: CT scan was showing small bowel obstruction.  Review of Systems: As per HPI otherwise 10 point review of systems negative.    Past Medical History  Diagnosis Date  . Bowel obstruction (HCC)   . Incarcerated hernia   . Hypothyroidism     Past Surgical History  Procedure Laterality Date  . Hernia repair    . Bowel resection    . Breast surgery       reports that she has never smoked. She does not have any smokeless tobacco history on file. She reports that she does not drink alcohol or use illicit drugs.  Allergies  Allergen Reactions  . Oxycodone     Family History  Problem Relation Age of Onset  . Family history unknown: Yes    Prior to Admission medications   Medication Sig Start Date End Date Taking? Authorizing Provider  ARIPiprazole (ABILIFY PO) Take 15 mg by mouth every morning.    Yes Historical Provider, MD  Ciclopirox 1 % shampoo Apply topically at bedtime.    Yes Historical Provider, MD  LAMOTRIGINE PO Take 100 mg by mouth every morning.    Yes Historical Provider, MD  levothyroxine (SYNTHROID, LEVOTHROID) 50 MCG tablet Take 50 mcg by mouth daily before breakfast.   Yes Historical Provider, MD  metroNIDAZOLE (METROGEL) 1 % gel Apply topically daily.   Yes Historical Provider, MD  traZODone (DESYREL) 100 MG tablet Take 100 mg by mouth at bedtime.   Yes Historical Provider, MD  lubiprostone (AMITIZA) 8 MCG capsule 24 mcg every evening.     Historical Provider, MD    Physical Exam: Filed Vitals:   01/13/16 1829 01/13/16 2034 01/13/16 2222 01/14/16 0024  BP: 139/81 119/71 112/63 111/65  Pulse: 72 67 70 61  Temp: 98.2 F (36.8 C)   98.5 F (36.9 C)  TempSrc: Oral   Oral  Resp: SpO2: 100% 96% 95% 94%      Constitutional: Appears normal. Filed Vitals:   01/13/16 1829 01/13/16 2034 01/13/16 2222 01/14/16 0024  BP: 139/81 119/71 112/63 111/65  Pulse: 72 67 70 61  Temp: 98.2 F (36.8 C)   98.5 F (36.9 C)  TempSrc: Oral   Oral  Resp: SpO2: 100% 96% 95% 94%   Eyes: Anicteric no pallor. ENMT: No discharge from the ears eyes nose or mouth. Neck: No mass felt. Neck appears supple. Respiratory: No rhonchi or crepitations. Cardiovascular: S1-S2 heard. Abdomen: Appears distended bowel sounds feeble no guarding or rigidity. Musculoskeletal: No edema.  Skin: No rash. Neurologic: Alert awake oriented to time place and person. Moves all extremities. Psychiatric: Appears normal.   Labs on Admission: I have personally reviewed following labs and imaging studies  CBC:  Recent Labs Lab 01/13/16 1911  WBC 5.3  NEUTROABS 3.9  HGB 13.2  HCT 40.6  MCV 92.1  PLT 228   Basic Metabolic Panel:  Recent Labs Lab 01/13/16 1911  NA 139  K 4.2  CL 102  CO2 29  GLUCOSE 108*  BUN 22*  CREATININE 1.22*  CALCIUM 10.0   GFR: CrCl cannot be calculated (Unknown ideal weight.). Liver Function Tests:  Recent  Labs Lab 01/13/16 1911  AST 16  ALT 11*  ALKPHOS 45  BILITOT 0.6  PROT 7.1  ALBUMIN 4.6    Recent Labs Lab 01/13/16 1911  LIPASE 27   No results for input(s): AMMONIA in the last 168 hours. Coagulation Profile: No results for input(s): INR, PROTIME in the last 168 hours. Cardiac Enzymes: No results for input(s): CKTOTAL, CKMB, CKMBINDEX, TROPONINI in the last 168 hours. BNP (last 3 results) No results for input(s): PROBNP in the last 8760 hours. HbA1C: No results for input(s): HGBA1C in the last 72 hours. CBG: No results for input(s): GLUCAP in the last 168 hours. Lipid Profile: No results for input(s): CHOL, HDL, LDLCALC, TRIG, CHOLHDL, LDLDIRECT in the last 72 hours. Thyroid Function Tests: No results for input(s): TSH, T4TOTAL, FREET4, T3FREE, THYROIDAB in the last 72 hours. Anemia Panel: No results for input(s): VITAMINB12, FOLATE, FERRITIN, TIBC, IRON, RETICCTPCT in the last 72 hours. Urine analysis: No results found for: COLORURINE, APPEARANCEUR, LABSPEC, PHURINE, GLUCOSEU, HGBUR, BILIRUBINUR, KETONESUR, PROTEINUR, UROBILINOGEN, NITRITE, LEUKOCYTESUR Sepsis Labs: @LABRCNTIP (procalcitonin:4,lacticidven:4) )No results found for this or any previous visit (from the past 240 hour(s)).   Radiological Exams on Admission: Ct Abdomen Pelvis W Contrast  01/13/2016  CLINICAL DATA:  Generalized abdominal pain since 5 p.m. tonight. History of herniated incarcerated bowel. EXAM: CT ABDOMEN AND PELVIS WITH CONTRAST TECHNIQUE: Multidetector CT imaging of the abdomen and pelvis was performed using the standard protocol following bolus administration of intravenous contrast. CONTRAST:  100mL ISOVUE-300 IOPAMIDOL (ISOVUE-300) INJECTION 61% COMPARISON:  CT abdomen pelvis -02/24/2014 FINDINGS: Lower chest: Limited visualization of the lower thorax demonstrates minimal linear subsegmental atelectasis within the image right lower lobe. No focal airspace opacities. No pleural effusion. Normal  heart size.  No pericardial effusion. Hepatobiliary: Normal hepatic contour. Note is made of an approximately 1.1 cm hypo attenuating (18 Hounsfield unit) cyst within the caudal subcapsular aspect of the medial segment of the left lobe of the liver (coronal image 41, series 5). Additional sub cm hypo attenuating hepatic lesions too small to accurately characterize though favored to represent hepatic cysts. Normal appearance of the gallbladder given degree distention. No radiopaque gallstones. No intra or extrahepatic bili duct dilatation. No ascites. Pancreas: Normal in appearance Spleen: Normal in appearance Adrenals/Urinary Tract: There is symmetric enhancement and excretion of the bilateral kidneys. Note is made of a large (approximately 7.4 cm hypo attenuating (7 Hounsfield unit) cyst which arises exophytic Li from the interpolar aspect of the right kidney and is noted to contain a thin internal septation, similar to the 02/2014 examination. Additional renal cysts are seen bilaterally. Sub cm hypo attenuating renal lesions are too small to adequately characterize of favored to represent additional renal cysts. Punctate (approximately 1.3 cm) exophytic hyper attenuating though nonenhancing lesion arising from the inferior pole the left kidney is unchanged since the 02/2014 examination and compatible with  a hyperdense renal cysts. No definite renal stones this postcontrast examination. Note is again made of a exaggerated horizontal lie of the right kidney. No urinary obstruction or perinephric stranding. Normal appearance of the bilateral adrenal glands. Normal appearance of the urinary bladder given degree distention. Multiple phleboliths are seen within the lower pelvis bilaterally. Stomach/Bowel: Postsurgical change involving a loop of small bowel within the midline of the abdomen (representative coronal image 10, series 5). There is mild upstream fluid distention of the small bowel with apparent transition  point within the distal small bowel within the midline of the lower abdomen/upper pelvis (representative axial image 53, series 2, coronal image 24, series 5) with relative decompression of the more distal downstream small bowel worrisome for a developing small bowel obstruction. No evidence of perforation or definable/ drainable fluid collection. No pneumoperitoneum, pneumatosis or portal venous gas. Scattered colonic diverticulosis without evidence of diverticulitis. Normal appearance of the terminal ileum and diminutive appearing appendix. Vascular/Lymphatic: Moderate amount of slightly irregular predominantly calcified plaque within a tortuous but normal caliber abdominal aorta. The major branch vessels of the abdominal aorta appear patent on this non CTA examination. No bulky retroperitoneal, mesenteric, pelvic or inguinal lymphadenopathy. Reproductive: Normal appearance of the pelvic organs for age. No discrete adnexal lesion. No free fluid in the pelvic cul-de-sac. Other: Tiny mesenteric fat containing periumbilical hernia. Regional soft tissues appear otherwise normal. Musculoskeletal: No acute or aggressive osseous abnormalities. Mild-to-moderate multilevel lumbar spine DDD. Old bilateral superior and inferior pubic rami fractures. Mild degenerative change of the bilateral hips, right greater than left. IMPRESSION: 1. Findings worrisome for small bowel obstruction with transition point located within the midline of the lower abdomen/upper pelvis. The etiology of this apparent transition point is not identified and thus is presumably secondary to adhesions. 2. Postsurgical change involving an adjacent loop of small bowel. 3. Colonic diverticulosis without evidence of diverticulitis. 4. Old bilateral superior and inferior pubic rami fractures Electronically Signed   By: Simonne Come M.D.   On: 01/13/2016 20:34     Assessment/Plan Principal Problem:   SBO (small bowel obstruction) (HCC) Active  Problems:   Hypothyroidism   History of breast cancer   History of bipolar disorder    #1. Small bowel obstruction - appreciate surgery consult. Surgery as advised NG tube placement only if patient gets recurrent nausea vomiting. Repeat x-ray is pending. Patient will be on IV fluids and kept nothing by mouth for now except medications. #2. Hypothyroidism - I have placed patient on IV Synthroid until patient can reliably take orally. #3. History of bipolar disorder - continue Abilify and Lamictal and trazodone. #4. Chronic kidney disease stage II - follow creatinine closely. #5. History of breast cancer presently on radiation therapy last one 2 weeks ago.   DVT prophylaxis: SCDs. Code Status: Full code.  Family Communication: No family at the bedside.  Disposition Plan: Home.  Consults called: General surgery.  Admission status: Admit to inpatient. MedSurg bed.    Eduard Clos MD [facility] Pager [phone].  If 7PM-7AM, please contact night-coverage [email or web address] Password [email or web address]  01/14/2016, 5:59 AM

## 2016-01-14 NOTE — Consult Note (Signed)
General Surgery [facility]  Reason for Consult: small bowel obstruction  Referring Physician: Dr. Helane Gunther, [facility]  Christina Graves is an 81 y.o. female.  HPI: patient is a 81 yo WF with acute onset of abd pain at approx 5 PM on 01/13/16.  Patient describes diffuse abd pain, crampy, with nausea.  Presented to [facility].  WBC normal.  Afebrile.  CT abd demonstrated mild small bowel distension with possible distal transition point consistent with small bowel obstruction.  Last BM yesterday.  Patient states passing lots of flatus.  Hx of incarcerated hernia requiring small bowel resection in [place] approx 10 years ago.  No other abdominal surgery.  Recent treatment for breast cancer in [place], [place].  Past Medical History  Diagnosis Date  . Bowel obstruction ([place])   . Incarcerated hernia     Past Surgical History  Procedure Laterality Date  . Hernia repair    . Bowel resection      History reviewed. No pertinent family history.  Social History:  reports that she has never smoked. She does not have any smokeless tobacco history on file. She reports that she does not drink alcohol or use illicit drugs.  Allergies:  Allergies  Allergen Reactions  . Oxycodone     Medications: I have reviewed the patient's current medications.  Results for orders placed or performed during the hospital encounter of 01/13/16 (from the past 48 hour(s))  CBC with Differential/Platelet     Status: None   Collection Time: 01/13/16  7:11 PM  Result Value Ref Range   WBC 5.3 4.0 - 10.5 K/uL   RBC 4.41 3.87 - 5.11 MIL/uL   Hemoglobin 13.2 12.0 - 15.0 g/dL   HCT 40.6 36.0 - 46.0 %   MCV 92.1 78.0 - 100.0 fL   MCH 29.9 26.0 - 34.0 pg   MCHC 32.5 30.0 - 36.0 g/dL   RDW 13.7 11.5 - 15.5 %   Platelets 228 150 - 400 K/uL   Neutrophils Relative % 72 %   Neutro Abs 3.9 1.7 - 7.7 K/uL   Lymphocytes Relative 15 %   Lymphs Abs 0.8 0.7 - 4.0 K/uL   Monocytes  Relative 9 %   Monocytes Absolute 0.5 0.1 - 1.0 K/uL   Eosinophils Relative 3 %   Eosinophils Absolute 0.2 0.0 - 0.7 K/uL   Basophils Relative 1 %   Basophils Absolute 0.0 0.0 - 0.1 K/uL  Lipase, blood     Status: None   Collection Time: 01/13/16  7:11 PM  Result Value Ref Range   Lipase 27 11 - 51 U/L  Comprehensive metabolic panel     Status: Abnormal   Collection Time: 01/13/16  7:11 PM  Result Value Ref Range   Sodium 139 135 - 145 mmol/L   Potassium 4.2 3.5 - 5.1 mmol/L   Chloride 102 101 - 111 mmol/L   CO2 29 22 - 32 mmol/L   Glucose, Bld 108 (H) 65 - 99 mg/dL   BUN 22 (H) 6 - 20 mg/dL   Creatinine, Ser 1.22 (H) 0.44 - 1.00 mg/dL   Calcium 10.0 8.9 - 10.3 mg/dL   Total Protein 7.1 6.5 - 8.1 g/dL   Albumin 4.6 3.5 - 5.0 g/dL   AST 16 15 - 41 U/L   ALT 11 (L) 14 - 54 U/L   Alkaline Phosphatase 45 38 - 126 U/L   Total Bilirubin 0.6 0.3 - 1.2 mg/dL   GFR calc  non Af Amer 41 (L) >60 mL/min   GFR calc Af Amer 48 (L) >60 mL/min    Comment: (NOTE) The eGFR has been calculated using the CKD EPI equation. This calculation has not been validated in all clinical situations. eGFR's persistently <60 mL/min signify possible Chronic Kidney Disease.    Anion gap 8 5 - 15    Ct Abdomen Pelvis W Contrast  01/13/2016  CLINICAL DATA:  Generalized abdominal pain since 5 p.m. tonight. History of herniated incarcerated bowel. EXAM: CT ABDOMEN AND PELVIS WITH CONTRAST TECHNIQUE: Multidetector CT imaging of the abdomen and pelvis was performed using the standard protocol following bolus administration of intravenous contrast. CONTRAST:  175m ISOVUE-300 IOPAMIDOL (ISOVUE-300) INJECTION 61% COMPARISON:  CT abdomen pelvis -02/24/2014 FINDINGS: Lower chest: Limited visualization of the lower thorax demonstrates minimal linear subsegmental atelectasis within the image right lower lobe. No focal airspace opacities. No pleural effusion. Normal heart size.  No pericardial effusion. Hepatobiliary: Normal  hepatic contour. Note is made of an approximately 1.1 cm hypo attenuating (18 Hounsfield unit) cyst within the caudal subcapsular aspect of the medial segment of the left lobe of the liver (coronal image 41, series 5). Additional sub cm hypo attenuating hepatic lesions too small to accurately characterize though favored to represent hepatic cysts. Normal appearance of the gallbladder given degree distention. No radiopaque gallstones. No intra or extrahepatic bili duct dilatation. No ascites. Pancreas: Normal in appearance Spleen: Normal in appearance Adrenals/Urinary Tract: There is symmetric enhancement and excretion of the bilateral kidneys. Note is made of a large (approximately 7.4 cm hypo attenuating (7 Hounsfield unit) cyst which arises exophytic Li from the interpolar aspect of the right kidney and is noted to contain a thin internal septation, similar to the 02/2014 examination. Additional renal cysts are seen bilaterally. Sub cm hypo attenuating renal lesions are too small to adequately characterize of favored to represent additional renal cysts. Punctate (approximately 1.3 cm) exophytic hyper attenuating though nonenhancing lesion arising from the inferior pole the left kidney is unchanged since the 02/2014 examination and compatible with a hyperdense renal cysts. No definite renal stones this postcontrast examination. Note is again made of a exaggerated horizontal lie of the right kidney. No urinary obstruction or perinephric stranding. Normal appearance of the bilateral adrenal glands. Normal appearance of the urinary bladder given degree distention. Multiple phleboliths are seen within the lower pelvis bilaterally. Stomach/Bowel: Postsurgical change involving a loop of small bowel within the midline of the abdomen (representative coronal image 10, series 5). There is mild upstream fluid distention of the small bowel with apparent transition point within the distal small bowel within the midline of the  lower abdomen/upper pelvis (representative axial image 53, series 2, coronal image 24, series 5) with relative decompression of the more distal downstream small bowel worrisome for a developing small bowel obstruction. No evidence of perforation or definable/ drainable fluid collection. No pneumoperitoneum, pneumatosis or portal venous gas. Scattered colonic diverticulosis without evidence of diverticulitis. Normal appearance of the terminal ileum and diminutive appearing appendix. Vascular/Lymphatic: Moderate amount of slightly irregular predominantly calcified plaque within a tortuous but normal caliber abdominal aorta. The major branch vessels of the abdominal aorta appear patent on this non CTA examination. No bulky retroperitoneal, mesenteric, pelvic or inguinal lymphadenopathy. Reproductive: Normal appearance of the pelvic organs for age. No discrete adnexal lesion. No free fluid in the pelvic cul-de-sac. Other: Tiny mesenteric fat containing periumbilical hernia. Regional soft tissues appear otherwise normal. Musculoskeletal: No acute or aggressive osseous abnormalities. Mild-to-moderate multilevel  lumbar spine DDD. Old bilateral superior and inferior pubic rami fractures. Mild degenerative change of the bilateral hips, right greater than left. IMPRESSION: 1. Findings worrisome for small bowel obstruction with transition point located within the midline of the lower abdomen/upper pelvis. The etiology of this apparent transition point is not identified and thus is presumably secondary to adhesions. 2. Postsurgical change involving an adjacent loop of small bowel. 3. Colonic diverticulosis without evidence of diverticulitis. 4. Old bilateral superior and inferior pubic rami fractures Electronically Signed   By: Sandi Mariscal M.D.   On: 01/13/2016 20:34    Review of Systems  Constitutional: Negative for fever and chills.  HENT: Negative.   Eyes: Negative.   Respiratory: Negative.   Cardiovascular:  Negative.   Gastrointestinal: Positive for nausea and abdominal pain (diffuse). Negative for diarrhea, constipation and blood in stool.  Genitourinary: Negative.   Musculoskeletal: Negative.   Skin: Negative.   Neurological: Negative.   Endo/Heme/Allergies: Negative.   Psychiatric/Behavioral: Negative.    Blood pressure 111/65, pulse 61, temperature 98.5 F (36.9 C), temperature source Oral, resp. rate 16, SpO2 94 %. Physical Exam  Constitutional: She is oriented to person, place, and time. She appears well-developed and well-nourished. No distress.  HENT:  Head: Normocephalic and atraumatic.  Right Ear: External ear normal.  Left Ear: External ear normal.  Eyes: Conjunctivae are normal. Pupils are equal, round, and reactive to light. No scleral icterus.  Neck: Normal range of motion. Neck supple. No tracheal deviation present. No thyromegaly present.  Cardiovascular: Normal rate, regular rhythm and normal heart sounds.   No murmur heard. Respiratory: Effort normal and breath sounds normal. No respiratory distress. She has no wheezes.  GI: Soft. Bowel sounds are normal. She exhibits distension (mild). She exhibits no mass. There is no tenderness. There is no rebound and no guarding.  Musculoskeletal: Normal range of motion. She exhibits no edema or tenderness.  Neurological: She is alert and oriented to person, place, and time.  Skin: Skin is warm and dry. She is not diaphoretic.  Psychiatric: She has a normal mood and affect. Her behavior is normal.    Assessment/Plan:  Small bowel obstruction (by CT), likely secondary to adhesions  Agree with admission  IV hydration, NPO - would hold off on NG unless persistent nausea or emesis  Encourage OOB, ambulation  AXR in AM 5/2  Will follow with you  Earnstine Regal, MD, [facility] Office: [place] 01/14/2016, 1:22 AM

## 2016-01-14 NOTE — Progress Notes (Signed)
Patient was just seen by Dr. Gerrit FriendsGerkin earlier this morning.    Small bowel obstruction (by CT), likely secondary to adhesions -Seems to be resolving quickly, great BS and lots of flatus -Allow sips of clears/ice -IV hydration, hold off on NG tube since she's improving -Encourage OOB, ambulation -Await AXR in AM 5/2, but clinically improved this am    Jorje GuildMegan Lawyer Washabaugh, [facility] [phone]

## 2016-01-15 DIAGNOSIS — Z8659 Personal history of other mental and behavioral disorders: Secondary | ICD-10-CM

## 2016-01-15 DIAGNOSIS — K567 Ileus, unspecified: Secondary | ICD-10-CM

## 2016-01-15 DIAGNOSIS — E038 Other specified hypothyroidism: Secondary | ICD-10-CM

## 2016-01-15 DIAGNOSIS — N183 Chronic kidney disease, stage 3 (moderate): Secondary | ICD-10-CM

## 2016-01-15 LAB — BASIC METABOLIC PANEL
ANION GAP: 6 (ref 5–15)
BUN: 10 mg/dL (ref 6–20)
CO2: 28 mmol/L (ref 22–32)
Calcium: 8.9 mg/dL (ref 8.9–10.3)
Chloride: 110 mmol/L (ref 101–111)
Creatinine, Ser: 1.01 mg/dL — ABNORMAL HIGH (ref 0.44–1.00)
GFR calc Af Amer: >60 mL/min (ref >60)
GFR calc non Af Amer: 52 mL/min — ABNORMAL LOW (ref >60)
GLUCOSE: 88 mg/dL (ref 65–99)
POTASSIUM: 3.8 mmol/L (ref 3.5–5.1)
Sodium: 144 mmol/L (ref 135–145)

## 2016-01-15 LAB — GLUCOSE, CAPILLARY
Glucose-Capillary: 86 mg/dL (ref 65–99)
Glucose-Capillary: 82 mg/dL (ref 65–99)

## 2016-01-15 MED ORDER — POLYETHYLENE GLYCOL 3350 17 G PO PACK: 17.0000 g | PACK | Freq: Two times a day (BID) | ORAL | Status: AC

## 2016-01-15 MED ORDER — BISACODYL 10 MG RE SUPP: 10.0000 mg | Freq: Every day | RECTAL | Status: AC | PRN

## 2016-01-15 MED ORDER — POLYETHYLENE GLYCOL 3350 17 G PO PACK
17.0000 g | PACK | Freq: Once | ORAL | Status: DC
  Filled 2016-01-15: qty 1

## 2016-01-15 MED ORDER — POLYETHYLENE GLYCOL 3350 17 G PO PACK
17.0000 g | PACK | Freq: Every day | ORAL | Status: DC
  Administered 2016-01-15: 17 g via ORAL
  Filled 2016-01-15: qty 1

## 2016-01-15 NOTE — Discharge Summary (Signed)
Physician Discharge Summary  Christina RobertsHelen Graves ZHY:[id number] DOB: 1935/01/01 DOA: 01/13/2016  PCP: Nadara EatonPIAZZA, MICHAEL J, MD  Admit date: 01/13/2016 Discharge date: 01/15/2016  Time spent: 50 minutes  Recommendations for Outpatient Follow-up:  1. May need a GI eval if constipation does not resolve with laxatives/ bowel regimen  Discharge Condition: stable    Discharge Diagnoses:  Principal Problem:   Ileus (HCC) Active Problems:   Hypothyroidism   History of breast cancer   History of bipolar disorder   CKD (chronic kidney disease) stage 3, GFR 30-59 ml/min   History of present illness:  81 y.o. female with medical history significant of breath history of breast cancer presently on radiation therapy, hypothyroidism and bipolar disorder with previous history of incarcerated bowel in the hernia presents to the ER with complaints of abdominal pain that started at 5 PM on 01/13/2016. Her last bowel movement was 01/09/2016. Workup in the emergency department including CT of the abdomen and pelvis revealed small bowel obstruction with the transition point at the midabdomen. Gen. surgery was consulted and recommended nonoperative management. The patient was made nothing by mouth and started on intravenous fluids.  Hospital Course:  Ileus/ chronic constipation - improved with conservative management - has been on solids since yesterday and is tolerating- had a small BM this AM which was small hard balls-  - she states she is on Amitiza daily but does not have a BM daily- often it is 4-5 days before she has a BM- have asked her to stop Amitiza and start Miralax 1- 2 times a day and to use a Dulcolax suppository if no BM in 2 days. Goal is to have BM every 1- 2 days  Hypothyroidism - synthroid    Bipolar disorder -  Abilify, trazodone, Lamictal  CKD stage III -Baseline creatinine 1.1-1.2  Breast cancer -Follows with radiation and medical oncology and [place] -last XRT 2 weeks prior to  admission   Consultations:  gen surgery  Discharge Exam: Filed Weights   01/14/16 1006  Weight: 61.961 kg (136 lb 9.6 oz)   Filed Vitals:   01/14/16 2118 01/15/16 0541  BP: 119/69 125/73  Pulse: 57 62  Temp: 97.9 F (36.6 C) 97.7 F (36.5 C)  Resp: 16 16    General: AAO x 3, no distress Cardiovascular: RRR, no murmurs  Respiratory: clear to auscultation bilaterally GI: soft, non-tender, non-distended, bowel sound positive  Discharge Instructions You were cared for by a hospitalist during your hospital stay. If you have any questions about your discharge medications or the care you received while you were in the hospital after you are discharged, you can call the unit and asked to speak with the hospitalist on call if the hospitalist that took care of you is not available. Once you are discharged, your primary care physician will handle any further medical issues. Please note that NO REFILLS for any discharge medications will be authorized once you are discharged, as it is imperative that you return to your primary care physician (or establish a relationship with a primary care physician if you do not have one) for your aftercare needs so that they can reassess your need for medications and monitor your lab values.      Discharge Instructions    Diet - low sodium heart healthy    Complete by:  As directed      Increase activity slowly    Complete by:  As directed  Medication List    STOP taking these medications        lubiprostone 8 MCG capsule  Commonly known as:  AMITIZA      TAKE these medications        ARIPiprazole 15 MG tablet  Commonly known as:  ABILIFY  Take 15 mg by mouth daily.     bisacodyl 10 MG suppository  Commonly known as:  DULCOLAX  Place 1 suppository (10 mg total) rectally daily as needed for moderate constipation. Use if you have not had a BM in 2 days     Ciclopirox 1 % shampoo  Apply 1 application topically 3 (three) times  a week.     lamoTRIgine 100 MG tablet  Commonly known as:  LAMICTAL  Take 100 mg by mouth daily.     levothyroxine 50 MCG tablet  Commonly known as:  SYNTHROID, LEVOTHROID  Take 50 mcg by mouth daily before breakfast.     Melatonin 5 MG Tbdp  Take 5 mg by mouth at bedtime.     metroNIDAZOLE 1 % gel  Commonly known as:  METROGEL  Apply 1 application topically daily. Apply cheeks for rosacea     multivitamin with minerals Tabs tablet  Take 1 tablet by mouth daily.     polyethylene glycol packet  Commonly known as:  MIRALAX / GLYCOLAX  Take 17 g by mouth 2 (two) times daily.     traZODone 100 MG tablet  Commonly known as:  DESYREL  Take 100 mg by mouth at bedtime.       Allergies  Allergen Reactions  . Oxycodone Other (See Comments)    Hallucinations.    Follow-up Information    Follow up with PIAZZA, Nolon Bussing, MD In 1 week.   Specialty:  Internal Medicine   Contact information:   [address] [place] [address] [phone]        The results of significant diagnostics from this hospitalization (including imaging, microbiology, ancillary and laboratory) are listed below for reference.    Significant Diagnostic Studies: Ct Abdomen Pelvis W Contrast  01/13/2016  CLINICAL DATA:  Generalized abdominal pain since 5 p.m. tonight. History of herniated incarcerated bowel. EXAM: CT ABDOMEN AND PELVIS WITH CONTRAST TECHNIQUE: Multidetector CT imaging of the abdomen and pelvis was performed using the standard protocol following bolus administration of intravenous contrast. CONTRAST:  ISOVUE-300 IOPAMIDOL (ISOVUE-300) INJECTION 61% COMPARISON:  CT abdomen pelvis -02/24/2014 FINDINGS: Lower chest: Limited visualization of the lower thorax demonstrates minimal linear subsegmental atelectasis within the image right lower lobe. No focal airspace opacities. No pleural effusion. Normal heart size.  No pericardial effusion. Hepatobiliary: Normal hepatic contour. Note is  made of an approximately 1.1 cm hypo attenuating (18 Hounsfield unit) cyst within the caudal subcapsular aspect of the medial segment of the left lobe of the liver (coronal image 41, series 5). Additional sub cm hypo attenuating hepatic lesions too small to accurately characterize though favored to represent hepatic cysts. Normal appearance of the gallbladder given degree distention. No radiopaque gallstones. No intra or extrahepatic bili duct dilatation. No ascites. Pancreas: Normal in appearance Spleen: Normal in appearance Adrenals/Urinary Tract: There is symmetric enhancement and excretion of the bilateral kidneys. Note is made of a large (approximately 7.4 cm hypo attenuating (7 Hounsfield unit) cyst which arises exophytic Li from the interpolar aspect of the right kidney and is noted to contain a thin internal septation, similar to the 02/2014 examination. Additional renal cysts are seen bilaterally. Sub cm  hypo attenuating renal lesions are too small to adequately characterize of favored to represent additional renal cysts. Punctate (approximately 1.3 cm) exophytic hyper attenuating though nonenhancing lesion arising from the inferior pole the left kidney is unchanged since the 02/2014 examination and compatible with a hyperdense renal cysts. No definite renal stones this postcontrast examination. Note is again made of a exaggerated horizontal lie of the right kidney. No urinary obstruction or perinephric stranding. Normal appearance of the bilateral adrenal glands. Normal appearance of the urinary bladder given degree distention. Multiple phleboliths are seen within the lower pelvis bilaterally. Stomach/Bowel: Postsurgical change involving a loop of small bowel within the midline of the abdomen (representative coronal image 10, series 5). There is mild upstream fluid distention of the small bowel with apparent transition point within the distal small bowel within the midline of the lower abdomen/upper pelvis  (representative axial image 53, series 2, coronal image 24, series 5) with relative decompression of the more distal downstream small bowel worrisome for a developing small bowel obstruction. No evidence of perforation or definable/ drainable fluid collection. No pneumoperitoneum, pneumatosis or portal venous gas. Scattered colonic diverticulosis without evidence of diverticulitis. Normal appearance of the terminal ileum and diminutive appearing appendix. Vascular/Lymphatic: Moderate amount of slightly irregular predominantly calcified plaque within a tortuous but normal caliber abdominal aorta. The major branch vessels of the abdominal aorta appear patent on this non CTA examination. No bulky retroperitoneal, mesenteric, pelvic or inguinal lymphadenopathy. Reproductive: Normal appearance of the pelvic organs for age. No discrete adnexal lesion. No free fluid in the pelvic cul-de-sac. Other: Tiny mesenteric fat containing periumbilical hernia. Regional soft tissues appear otherwise normal. Musculoskeletal: No acute or aggressive osseous abnormalities. Mild-to-moderate multilevel lumbar spine DDD. Old bilateral superior and inferior pubic rami fractures. Mild degenerative change of the bilateral hips, right greater than left. IMPRESSION: 1. Findings worrisome for small bowel obstruction with transition point located within the midline of the lower abdomen/upper pelvis. The etiology of this apparent transition point is not identified and thus is presumably secondary to adhesions. 2. Postsurgical change involving an adjacent loop of small bowel. 3. Colonic diverticulosis without evidence of diverticulitis. 4. Old bilateral superior and inferior pubic rami fractures Electronically Signed   By: Simonne Come M.D.   On: 01/13/2016 20:34   Dg Abd 2 Views  01/14/2016  CLINICAL DATA:  Small bowel obstruction due to adhesions. EXAM: ABDOMEN - 2 VIEW COMPARISON:  CT scan of Jan 13, 2016.  Radiograph of April 30, 2015.  FINDINGS: Residual contrast is seen in distal colon. No colonic dilatation is noted. Mildly dilated small bowel loops are noted concerning for distal small bowel obstruction or ileus. Phleboliths are noted in the pelvis. No definite free air is noted. IMPRESSION: Mildly dilated small bowel loops are noted which may represent ileus or possibly distal small bowel obstruction. Residual contrast see seen in the distal colon. Electronically Signed   By: Lupita Raider, M.D.   On: 01/14/2016 09:13    Microbiology: No results found for this or any previous visit (from the past 240 hour(s)).   Labs: Basic Metabolic Panel:  Recent Labs Lab 01/13/16 1911 01/14/16 0615 01/15/16 0437  NA 139 144 144  K 4.2 4.1 3.8  CL 102 108 110  CO2 29 28 28   GLUCOSE 108* 92 88  BUN 22* 19 10  CREATININE 1.22* 1.25* 1.01*  CALCIUM 10.0 9.3 8.9   Liver Function Tests:  Recent Labs Lab 01/13/16 1911 01/14/16 0615  AST 16 13*  ALT 11* 10*  ALKPHOS 45 35*  BILITOT 0.6 0.4  PROT 7.1 6.2*  ALBUMIN 4.6 4.0    Recent Labs Lab 01/13/16 1911  LIPASE 27   No results for input(s): AMMONIA in the last 168 hours. CBC:  Recent Labs Lab 01/13/16 1911 01/14/16 0615  WBC 5.3 5.6  NEUTROABS 3.9 4.4  HGB 13.2 12.3  HCT 40.6 38.3  MCV 92.1 91.2  PLT 228 221   Cardiac Enzymes: No results for input(s): CKTOTAL, CKMB, CKMBINDEX, TROPONINI in the last 168 hours. BNP: BNP (last 3 results) No results for input(s): BNP in the last 8760 hours.  ProBNP (last 3 results) No results for input(s): PROBNP in the last 8760 hours.  CBG:  Recent Labs Lab 01/14/16 1259 01/14/16 1708 01/14/16 2340 01/15/16 0634 01/15/16 1233  GLUCAP 163* 99 98 86 82       SignedCalvert Cantor, MD [facility] 01/15/2016, 1:59 PM

## 2016-01-15 NOTE — Progress Notes (Signed)
Telephone order received from MD Rizwant to place order for patient to be discharged home, orders entered, discharge instructions reviewed with patient and spouse, patient to follow up with PCP after discharge Stanford BreedBracey, Catori Panozzo N RN 5:27 PM 01-15-2016

## 2016-01-15 NOTE — Progress Notes (Signed)
[facility] Progress Note     Subjective: Pt feels great, already had breakfast.  No N/V, no abdominal pain/distension.  Had a small BM.  Ambulating well.  Objective: Vital signs in last 24 hours: Temp:  [97.7 F (36.5 C)-98.2 F (36.8 C)] 97.7 F (36.5 C) (05/03 0541) Pulse Rate:  [57-62] 62 (05/03 0541) Resp:  [16] 16 (05/03 0541) BP: (119-128)/(69-73) 125/73 mmHg (05/03 0541) SpO2:  [96 %-98 %] 98 % (05/03 0541) Weight:  [136 lb 9.6 oz (61.961 kg)] 136 lb 9.6 oz (61.961 kg) (05/02 1006) Last BM Date: 01/14/16  Intake/Output from previous day: 05/02 0701 - 05/03 0700 In: 988.8 [P.O.:120; I.V.:868.8] Out: 201 [Urine:200; Stool:1] Intake/Output this shift: Total I/O In: -  Out: 350 [Urine:350]  PE: Gen:  Alert, NAD, pleasant Abd: Soft, mild distension, NT, +BS, no HSM   Lab Results:   Recent Labs  01/13/16 1911 01/14/16 0615  WBC 5.3 5.6  HGB 13.2 12.3  HCT 40.6 38.3  PLT 228 221   BMET  Recent Labs  01/14/16 0615 01/15/16 0437  NA 144 144  K 4.1 3.8  CL 108 110  CO2 28 28  GLUCOSE 92 88  BUN 19 10  CREATININE 1.25* 1.01*  CALCIUM 9.3 8.9   PT/INR No results for input(s): LABPROT, INR in the last 72 hours. CMP     Component Value Date/Time   NA 144 01/15/2016 0437   K 3.8 01/15/2016 0437   CL 110 01/15/2016 0437   CO2 28 01/15/2016 0437   GLUCOSE 88 01/15/2016 0437   BUN 10 01/15/2016 0437   CREATININE 1.01* 01/15/2016 0437   CALCIUM 8.9 01/15/2016 0437   PROT 6.2* 01/14/2016 0615   ALBUMIN 4.0 01/14/2016 0615   AST 13* 01/14/2016 0615   ALT 10* 01/14/2016 0615   ALKPHOS 35* 01/14/2016 0615   BILITOT 0.4 01/14/2016 0615   GFRNONAA 52* 01/15/2016 0437   GFRAA >60 01/15/2016 0437   Lipase     Component Value Date/Time   LIPASE 27 01/13/2016 1911       Studies/Results: Ct Abdomen Pelvis W Contrast  01/13/2016  CLINICAL DATA:  Generalized abdominal pain since 5 p.m. tonight. History of herniated incarcerated bowel.  EXAM: CT ABDOMEN AND PELVIS WITH CONTRAST TECHNIQUE: Multidetector CT imaging of the abdomen and pelvis was performed using the standard protocol following bolus administration of intravenous contrast. CONTRAST:  100mL ISOVUE-300 IOPAMIDOL (ISOVUE-300) INJECTION 61% COMPARISON:  CT abdomen pelvis -02/24/2014 FINDINGS: Lower chest: Limited visualization of the lower thorax demonstrates minimal linear subsegmental atelectasis within the image right lower lobe. No focal airspace opacities. No pleural effusion. Normal heart size.  No pericardial effusion. Hepatobiliary: Normal hepatic contour. Note is made of an approximately 1.1 cm hypo attenuating (18 Hounsfield unit) cyst within the caudal subcapsular aspect of the medial segment of the left lobe of the liver (coronal image 41, series 5). Additional sub cm hypo attenuating hepatic lesions too small to accurately characterize though favored to represent hepatic cysts. Normal appearance of the gallbladder given degree distention. No radiopaque gallstones. No intra or extrahepatic bili duct dilatation. No ascites. Pancreas: Normal in appearance Spleen: Normal in appearance Adrenals/Urinary Tract: There is symmetric enhancement and excretion of the bilateral kidneys. Note is made of a large (approximately 7.4 cm hypo attenuating (7 Hounsfield unit) cyst which arises exophytic Li from the interpolar aspect of the right kidney and is noted to contain a thin internal septation, similar to the 02/2014 examination. Additional renal cysts are  seen bilaterally. Sub cm hypo attenuating renal lesions are too small to adequately characterize of favored to represent additional renal cysts. Punctate (approximately 1.3 cm) exophytic hyper attenuating though nonenhancing lesion arising from the inferior pole the left kidney is unchanged since the 02/2014 examination and compatible with a hyperdense renal cysts. No definite renal stones this postcontrast examination. Note is again made  of a exaggerated horizontal lie of the right kidney. No urinary obstruction or perinephric stranding. Normal appearance of the bilateral adrenal glands. Normal appearance of the urinary bladder given degree distention. Multiple phleboliths are seen within the lower pelvis bilaterally. Stomach/Bowel: Postsurgical change involving a loop of small bowel within the midline of the abdomen (representative coronal image 10, series 5). There is mild upstream fluid distention of the small bowel with apparent transition point within the distal small bowel within the midline of the lower abdomen/upper pelvis (representative axial image 53, series 2, coronal image 24, series 5) with relative decompression of the more distal downstream small bowel worrisome for a developing small bowel obstruction. No evidence of perforation or definable/ drainable fluid collection. No pneumoperitoneum, pneumatosis or portal venous gas. Scattered colonic diverticulosis without evidence of diverticulitis. Normal appearance of the terminal ileum and diminutive appearing appendix. Vascular/Lymphatic: Moderate amount of slightly irregular predominantly calcified plaque within a tortuous but normal caliber abdominal aorta. The major branch vessels of the abdominal aorta appear patent on this non CTA examination. No bulky retroperitoneal, mesenteric, pelvic or inguinal lymphadenopathy. Reproductive: Normal appearance of the pelvic organs for age. No discrete adnexal lesion. No free fluid in the pelvic cul-de-sac. Other: Tiny mesenteric fat containing periumbilical hernia. Regional soft tissues appear otherwise normal. Musculoskeletal: No acute or aggressive osseous abnormalities. Mild-to-moderate multilevel lumbar spine DDD. Old bilateral superior and inferior pubic rami fractures. Mild degenerative change of the bilateral hips, right greater than left. IMPRESSION: 1. Findings worrisome for small bowel obstruction with transition point located within  the midline of the lower abdomen/upper pelvis. The etiology of this apparent transition point is not identified and thus is presumably secondary to adhesions. 2. Postsurgical change involving an adjacent loop of small bowel. 3. Colonic diverticulosis without evidence of diverticulitis. 4. Old bilateral superior and inferior pubic rami fractures Electronically Signed   By: Simonne Come M.D.   On: 01/13/2016 20:34   Dg Abd 2 Views  01/14/2016  CLINICAL DATA:  Small bowel obstruction due to adhesions. EXAM: ABDOMEN - 2 VIEW COMPARISON:  CT scan of Jan 13, 2016.  Radiograph of April 30, 2015. FINDINGS: Residual contrast is seen in distal colon. No colonic dilatation is noted. Mildly dilated small bowel loops are noted concerning for distal small bowel obstruction or ileus. Phleboliths are noted in the pelvis. No definite free air is noted. IMPRESSION: Mildly dilated small bowel loops are noted which may represent ileus or possibly distal small bowel obstruction. Residual contrast see seen in the distal colon. Electronically Signed   By: Lupita Raider, M.D.   On: 01/14/2016 09:13    Anti-infectives: Anti-infectives    None       Assessment/Plan Small bowel obstruction (by CT), likely secondary to adhesions vs ileus -Seems to be resolving quickly, great BS and lots of flatus -Encourage OOB, ambulation -D/c home if tolerating soft diet -Recommend colace BID plus miralax 1-2x/day to titrate to 1BM/day.  Discussed hydration and activity to help with bowel function   LOS: 2 days    Nonie Hoyer 01/15/2016, 9:22 AM Pager: [phone]  (7am - 4:30pm  M-F; 7am - 11:30am Sa/Su)

## 2017-06-14 IMAGING — CT CT ABD-PELV W/ CM
2 of 5 series · 14 of 46 positions shown, 16 images · IV contrast (APPLIED)
Comparison: CT abdomen pelvis -02/24/2014

CLINICAL DATA: Generalized abdominal pain since 5 p.m. tonight.
History of herniated incarcerated bowel.

EXAM:
CT ABDOMEN AND PELVIS WITH CONTRAST
TECHNIQUE: Multidetector CT imaging of the abdomen and pelvis was performed
using the standard protocol following bolus administration of
intravenous contrast.
CONTRAST:  100mL 8GXWGN-IJJ IOPAMIDOL (8GXWGN-IJJ) INJECTION 61%

[Series 2: axial st · axial · 0.95mm/px · z∈[-443,-103]mm · 11 of 78 slices shown, 13 images]
[im 5/78  soft-tissue]
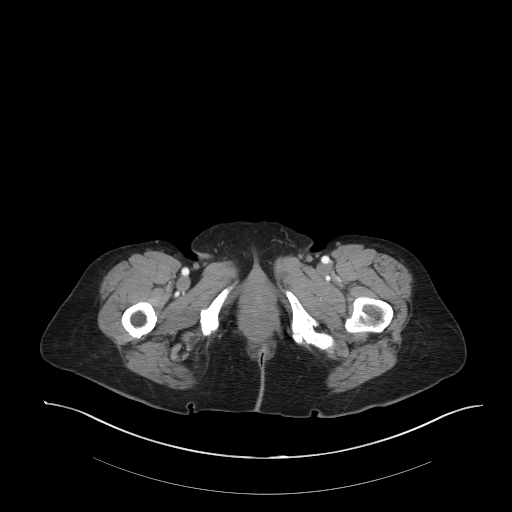
[im 5/78  bone]
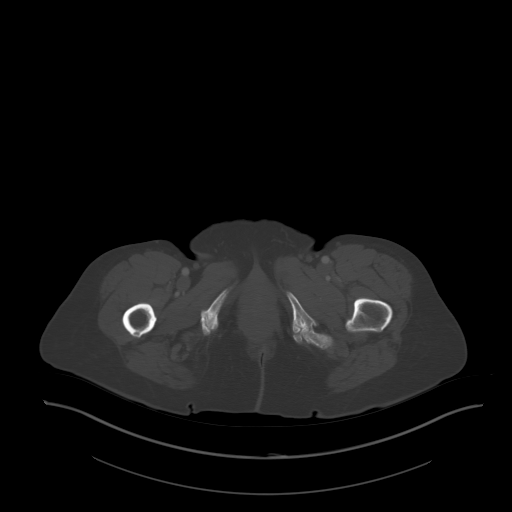
[im 14/78  soft-tissue]
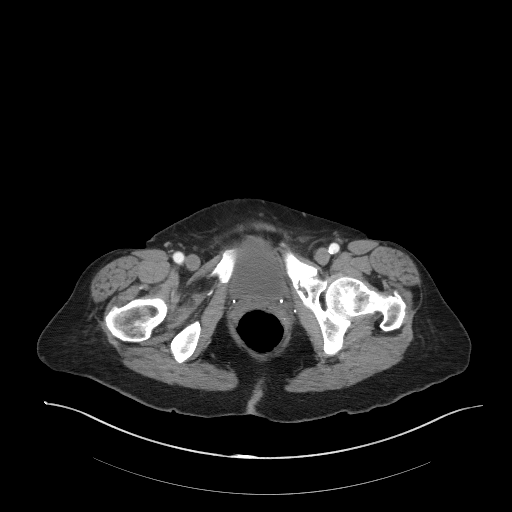
[im 19/78  soft-tissue]
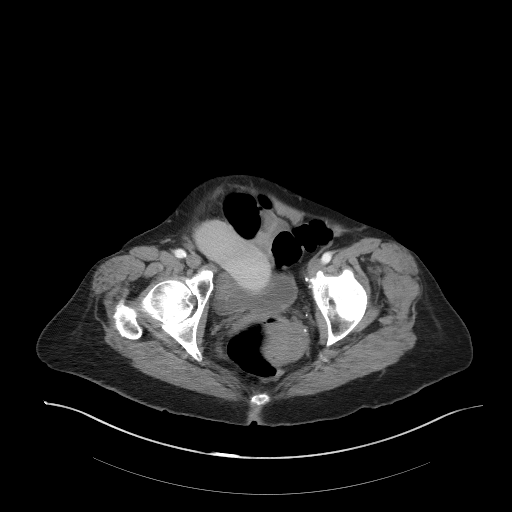
[im 28/78  soft-tissue]
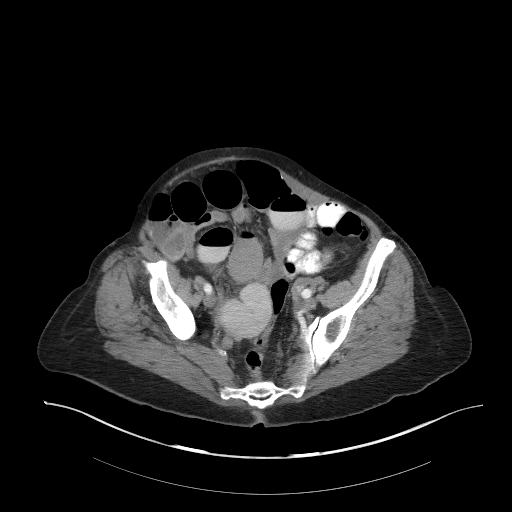
[im 32/78  soft-tissue]
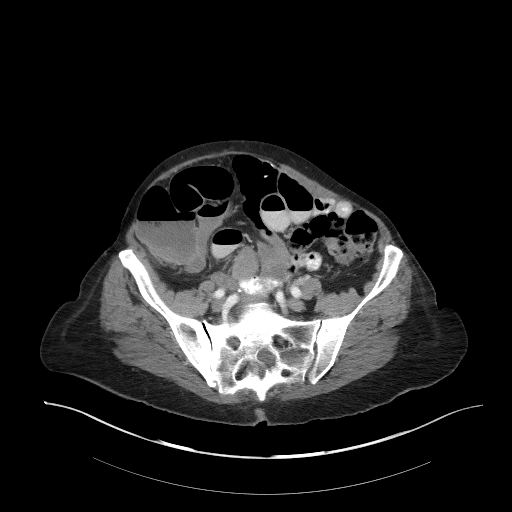
[im 41/78  soft-tissue]
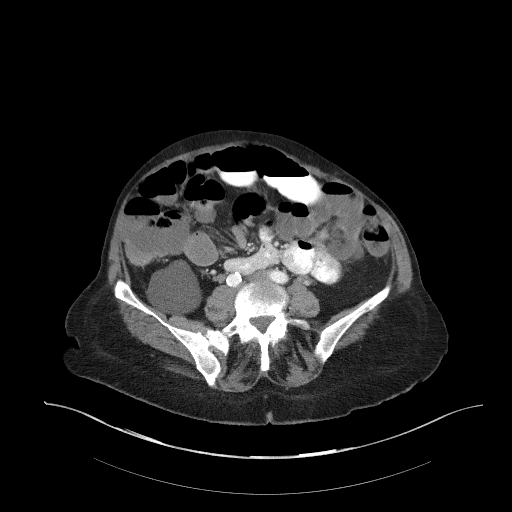
[im 46/78  soft-tissue]
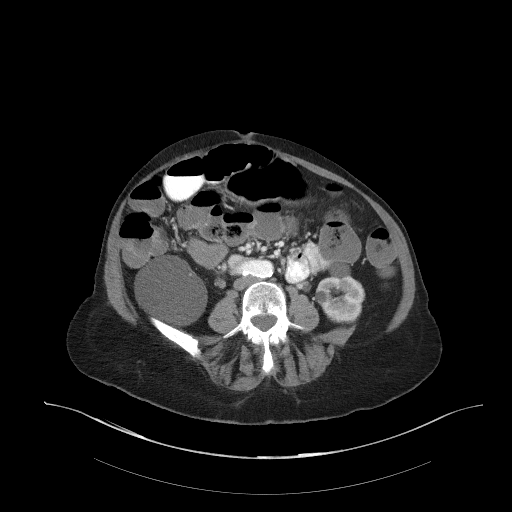
[im 50/78  soft-tissue]
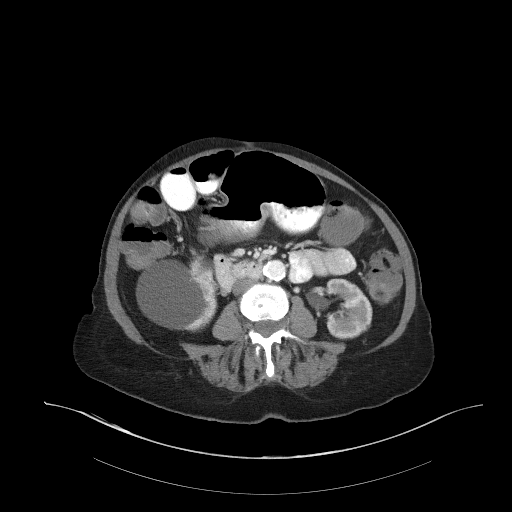
[im 59/78  soft-tissue]
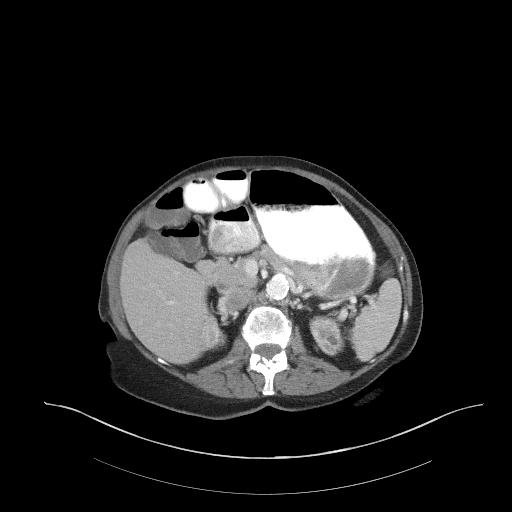
[im 59/78  bone]
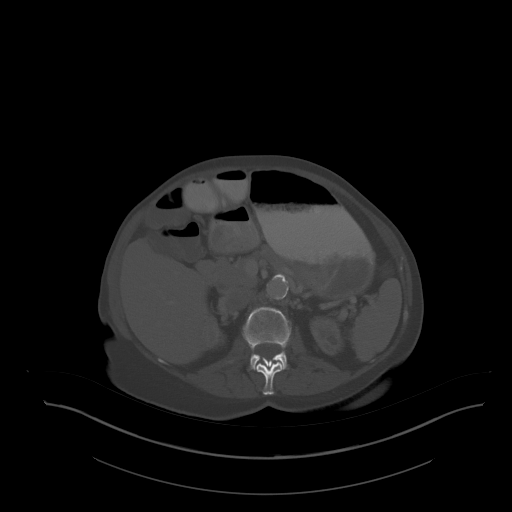
[im 64/78  soft-tissue]
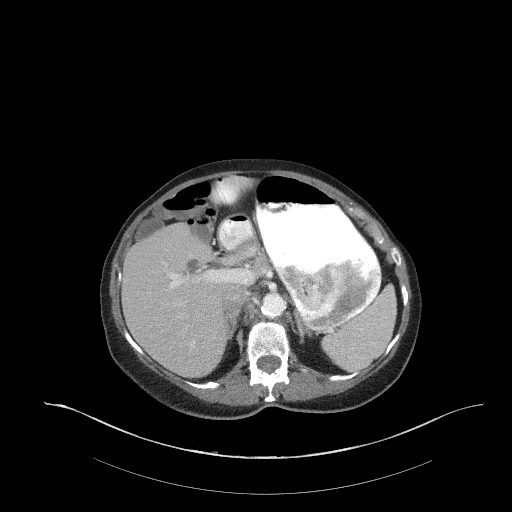
[im 73/78  soft-tissue]
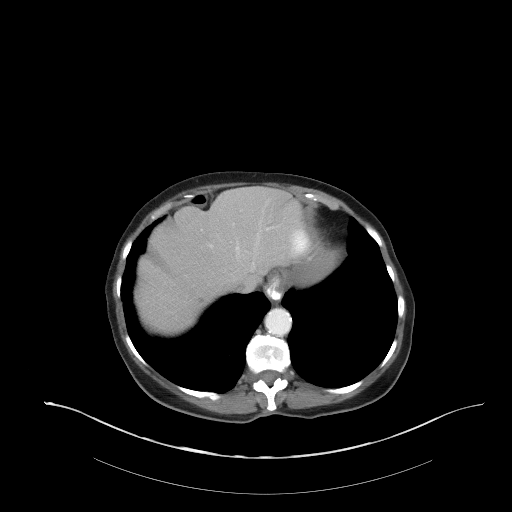

[Series 5: coronal st · coronal · 0.70mm/px · 3 of 87 slices shown]
[im 29/87  soft-tissue]
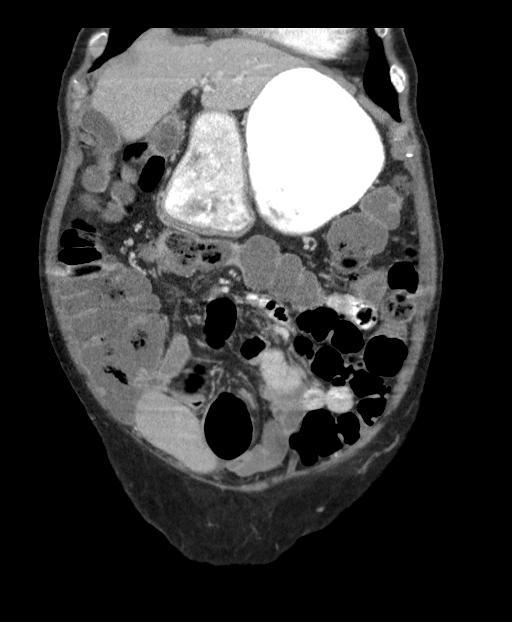
[im 39/87  soft-tissue]
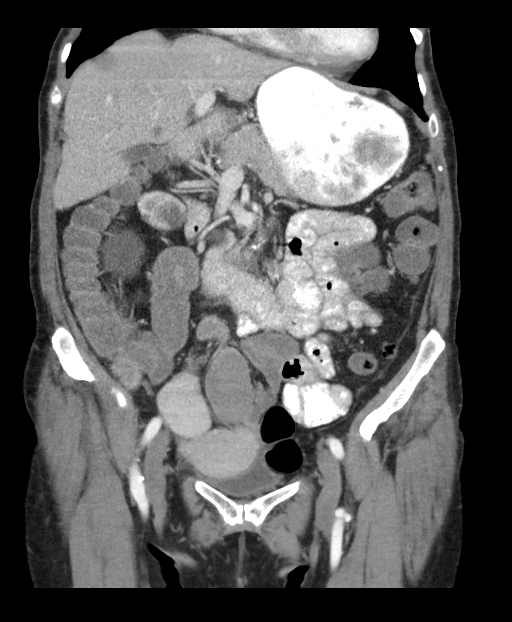
[im 48/87  soft-tissue]
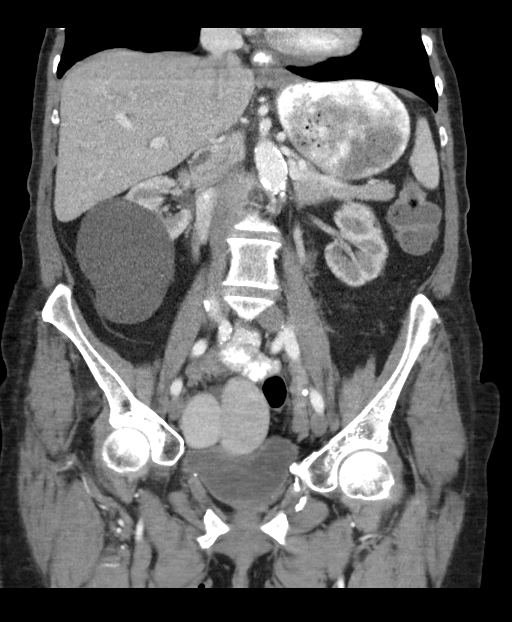

[14 of 46 positions shown; findings below may reference images not displayed]

FINDINGS: Lower chest: Limited visualization of the lower thorax demonstrates
minimal linear subsegmental atelectasis within the image right lower
lobe. No focal airspace opacities. No pleural effusion.

Normal heart size.  No pericardial effusion.

Hepatobiliary: Normal hepatic contour. Note is made of an
approximately 1.1 cm hypo attenuating (18 Hounsfield unit) cyst
within the caudal subcapsular aspect of the medial segment of the
left lobe of the liver (coronal image 41, series 5). Additional sub
cm hypo attenuating hepatic lesions too small to accurately
characterize though favored to represent hepatic cysts. Normal
appearance of the gallbladder given degree distention. No radiopaque
gallstones. No intra or extrahepatic bili duct dilatation. No
ascites.

Pancreas: Normal in appearance

Spleen: Normal in appearance

Adrenals/Urinary Tract: There is symmetric enhancement and excretion
of the bilateral kidneys. Note is made of a large (approximately
cm hypo attenuating (7 Hounsfield unit) cyst which arises exophytic
Alieff from the interpolar aspect of the right kidney and is noted to
contain a thin internal septation, similar to the [DATE]
examination. Additional renal cysts are seen bilaterally. Sub cm
hypo attenuating renal lesions are too small to adequately
characterize of favored to represent additional renal cysts.
Punctate (approximately 1.3 cm) exophytic hyper attenuating though
nonenhancing lesion arising from the inferior pole the left kidney
is unchanged since the [DATE] examination and compatible with a
hyperdense renal cysts. No definite renal stones this postcontrast
examination. Note is again made of a exaggerated horizontal lie of
the right kidney. No urinary obstruction or perinephric stranding.

Normal appearance of the bilateral adrenal glands.

Normal appearance of the urinary bladder given degree distention.
Multiple phleboliths are seen within the lower pelvis bilaterally.

Stomach/Bowel: Postsurgical change involving a loop of small bowel
within the midline of the abdomen (representative coronal image 10,
series 5).

There is mild upstream fluid distention of the small bowel with
apparent transition point within the distal small bowel within the
midline of the lower abdomen/upper pelvis (representative axial
image 53, series 2, coronal image 24, series 5) with relative
decompression of the more distal downstream small bowel worrisome
for a developing small bowel obstruction. No evidence of perforation
or definable/ drainable fluid collection. No pneumoperitoneum,
pneumatosis or portal venous gas.

Scattered colonic diverticulosis without evidence of diverticulitis.
Normal appearance of the terminal ileum and diminutive appearing
appendix.

Vascular/Lymphatic: Moderate amount of slightly irregular
predominantly calcified plaque within a tortuous but normal caliber
abdominal aorta. The major branch vessels of the abdominal aorta
appear patent on this non CTA examination.

No bulky retroperitoneal, mesenteric, pelvic or inguinal
lymphadenopathy.

Reproductive: Normal appearance of the pelvic organs for age. No
discrete adnexal lesion. No free fluid in the pelvic cul-de-sac.

Other: Tiny mesenteric fat containing periumbilical hernia. Regional
soft tissues appear otherwise normal.

Musculoskeletal: No acute or aggressive osseous abnormalities.
Mild-to-moderate multilevel lumbar spine DDD. Old bilateral superior
and inferior pubic rami fractures. Mild degenerative change of the
bilateral hips, right greater than left.
IMPRESSION: 1. Findings worrisome for small bowel obstruction with transition
point located within the midline of the lower abdomen/upper pelvis.
The etiology of this apparent transition point is not identified and
thus is presumably secondary to adhesions.
2. Postsurgical change involving an adjacent loop of small bowel.
3. Colonic diverticulosis without evidence of diverticulitis.
4. Old bilateral superior and inferior pubic rami fractures

## 2020-12-24 ENCOUNTER — Other Ambulatory Visit (HOSPITAL_COMMUNITY): Payer: Self-pay

## 2022-02-12 DEATH — deceased
